# Patient Record
Sex: Male | Born: 1960 | Race: White | Hispanic: No | Marital: Single | Smoking: Current every day smoker
Health system: Southern US, Community
[De-identification: ages and names within clinical notes are randomized; demographics above are authoritative.]

## PROBLEM LIST (undated history)

## (undated) DIAGNOSIS — I10 Essential (primary) hypertension: Secondary | ICD-10-CM

## (undated) HISTORY — PX: HERNIA REPAIR: SHX51

---

## 2017-11-17 ENCOUNTER — Other Ambulatory Visit: Payer: Self-pay

## 2017-11-17 ENCOUNTER — Emergency Department (HOSPITAL_COMMUNITY): Payer: Self-pay

## 2017-11-17 ENCOUNTER — Encounter (HOSPITAL_COMMUNITY): Payer: Self-pay | Admitting: Emergency Medicine

## 2017-11-17 ENCOUNTER — Inpatient Hospital Stay (HOSPITAL_COMMUNITY)
Admission: EM | Admit: 2017-11-17 | Discharge: 2017-11-20 | DRG: 418 | Disposition: A | Discharge status: Home / Self Care | Payer: Self-pay | Attending: General Surgery | Admitting: General Surgery

## 2017-11-17 DIAGNOSIS — R7302 Impaired glucose tolerance (oral): Secondary | ICD-10-CM | POA: Diagnosis present

## 2017-11-17 DIAGNOSIS — F17210 Nicotine dependence, cigarettes, uncomplicated: Secondary | ICD-10-CM | POA: Diagnosis present

## 2017-11-17 DIAGNOSIS — R7989 Other specified abnormal findings of blood chemistry: Secondary | ICD-10-CM | POA: Insufficient documentation

## 2017-11-17 DIAGNOSIS — R739 Hyperglycemia, unspecified: Secondary | ICD-10-CM

## 2017-11-17 DIAGNOSIS — Z79899 Other long term (current) drug therapy: Secondary | ICD-10-CM

## 2017-11-17 DIAGNOSIS — I10 Essential (primary) hypertension: Secondary | ICD-10-CM | POA: Diagnosis present

## 2017-11-17 DIAGNOSIS — R000 Tachycardia, unspecified: Secondary | ICD-10-CM | POA: Diagnosis present

## 2017-11-17 DIAGNOSIS — I248 Other forms of acute ischemic heart disease: Secondary | ICD-10-CM | POA: Diagnosis present

## 2017-11-17 DIAGNOSIS — Z01810 Encounter for preprocedural cardiovascular examination: Secondary | ICD-10-CM

## 2017-11-17 DIAGNOSIS — K8000 Calculus of gallbladder with acute cholecystitis without obstruction: Principal | ICD-10-CM | POA: Diagnosis present

## 2017-11-17 DIAGNOSIS — R778 Other specified abnormalities of plasma proteins: Secondary | ICD-10-CM

## 2017-11-17 DIAGNOSIS — Z720 Tobacco use: Secondary | ICD-10-CM

## 2017-11-17 DIAGNOSIS — K810 Acute cholecystitis: Secondary | ICD-10-CM

## 2017-11-17 HISTORY — DX: Essential (primary) hypertension: I10

## 2017-11-17 LAB — RAPID URINE DRUG SCREEN, HOSP PERFORMED
Amphetamines: NOT DETECTED
Benzodiazepines: NOT DETECTED
Cocaine: NOT DETECTED
OPIATES: NOT DETECTED
Tetrahydrocannabinol: POSITIVE — AB

## 2017-11-17 LAB — URINALYSIS, ROUTINE W REFLEX MICROSCOPIC
Bilirubin Urine: NEGATIVE
Glucose, UA: 150 mg/dL — AB
HGB URINE DIPSTICK: NEGATIVE
KETONES UR: NEGATIVE mg/dL
LEUKOCYTES UA: NEGATIVE
Nitrite: NEGATIVE
PROTEIN: NEGATIVE mg/dL
Specific Gravity, Urine: >1.046 — ABNORMAL HIGH (ref 1.005–1.030)
pH: 7 (ref 5.0–8.0)

## 2017-11-17 LAB — COMPREHENSIVE METABOLIC PANEL
ALBUMIN: 4.3 g/dL (ref 3.5–5.0)
ALK PHOS: 67 U/L (ref 38–126)
ALT: 17 U/L (ref 0–44)
ANION GAP: 12 (ref 5–15)
AST: 19 U/L (ref 15–41)
BILIRUBIN TOTAL: 1.2 mg/dL (ref 0.3–1.2)
BUN: 19 mg/dL (ref 6–20)
CALCIUM: 9.1 mg/dL (ref 8.9–10.3)
CO2: 22 mmol/L (ref 22–32)
Chloride: 102 mmol/L (ref 98–111)
Creatinine, Ser: 0.97 mg/dL (ref 0.61–1.24)
GFR calc Af Amer: >60 mL/min (ref >60)
GLUCOSE: 198 mg/dL — AB (ref 70–99)
Potassium: 3.4 mmol/L — ABNORMAL LOW (ref 3.5–5.1)
Sodium: 136 mmol/L (ref 135–145)
TOTAL PROTEIN: 7.8 g/dL (ref 6.5–8.1)

## 2017-11-17 LAB — I-STAT TROPONIN, ED
TROPONIN I, POC: 0.09 ng/mL — AB (ref 0.00–0.08)
Troponin i, poc: 0.08 ng/mL (ref 0.00–0.08)

## 2017-11-17 LAB — CBC WITH DIFFERENTIAL/PLATELET
BASOS ABS: 0 10*3/uL (ref 0.0–0.1)
BASOS PCT: 0 %
Eosinophils Absolute: 0 10*3/uL (ref 0.0–0.7)
Eosinophils Relative: 0 %
HCT: 46.7 % (ref 39.0–52.0)
Hemoglobin: 16.3 g/dL (ref 13.0–17.0)
Lymphocytes Relative: 6 %
Lymphs Abs: 1.1 10*3/uL (ref 0.7–4.0)
MCH: 31 pg (ref 26.0–34.0)
MCHC: 34.9 g/dL (ref 30.0–36.0)
MCV: 88.8 fL (ref 78.0–100.0)
Monocytes Absolute: 1.1 10*3/uL — ABNORMAL HIGH (ref 0.1–1.0)
Monocytes Relative: 5 %
Neutro Abs: 17.6 10*3/uL — ABNORMAL HIGH (ref 1.7–7.7)
Neutrophils Relative %: 89 %
PLATELETS: 128 10*3/uL — AB (ref 150–400)
RBC: 5.26 MIL/uL (ref 4.22–5.81)
RDW: 13.7 % (ref 11.5–15.5)
WBC: 19.8 10*3/uL — AB (ref 4.0–10.5)

## 2017-11-17 LAB — LIPASE, BLOOD: Lipase: 25 U/L (ref 11–51)

## 2017-11-17 LAB — HEMOGLOBIN A1C
Hgb A1c MFr Bld: 5.9 % — ABNORMAL HIGH (ref 4.8–5.6)
MEAN PLASMA GLUCOSE: 122.63 mg/dL

## 2017-11-17 LAB — GLUCOSE, CAPILLARY
GLUCOSE-CAPILLARY: 158 mg/dL — AB (ref 70–99)
Glucose-Capillary: 169 mg/dL — ABNORMAL HIGH (ref 70–99)

## 2017-11-17 LAB — TROPONIN I
Troponin I: 0.15 ng/mL (ref <0.03)
Troponin I: 0.11 ng/mL (ref <0.03)

## 2017-11-17 MED ORDER — PIPERACILLIN-TAZOBACTAM 3.375 G IVPB
3.3750 g | Freq: Three times a day (TID) | INTRAVENOUS | Status: DC
  Administered 2017-11-17 – 2017-11-19 (×7): 3.375 g via INTRAVENOUS
  Filled 2017-11-17 (×6): qty 50

## 2017-11-17 MED ORDER — IOPAMIDOL (ISOVUE-370) INJECTION 76%
100.0000 mL | Freq: Once | INTRAVENOUS | Status: AC | PRN
  Administered 2017-11-17: 100 mL via INTRAVENOUS

## 2017-11-17 MED ORDER — INSULIN ASPART 100 UNIT/ML ~~LOC~~ SOLN: 0.0000 [IU] | Freq: Every day | SUBCUTANEOUS | Status: DC

## 2017-11-17 MED ORDER — POTASSIUM CHLORIDE IN NACL 20-0.9 MEQ/L-% IV SOLN
INTRAVENOUS | Status: DC
  Administered 2017-11-17 – 2017-11-19 (×5): via INTRAVENOUS

## 2017-11-17 MED ORDER — PIPERACILLIN-TAZOBACTAM 3.375 G IVPB 30 MIN
3.3750 g | Freq: Once | INTRAVENOUS | Status: AC
  Administered 2017-11-17: 3.375 g via INTRAVENOUS
  Filled 2017-11-17: qty 50

## 2017-11-17 MED ORDER — HYDRALAZINE HCL 20 MG/ML IJ SOLN
10.0000 mg | Freq: Once | INTRAMUSCULAR | Status: AC
  Administered 2017-11-17: 10 mg via INTRAVENOUS
  Filled 2017-11-17: qty 1

## 2017-11-17 MED ORDER — MORPHINE SULFATE (PF) 2 MG/ML IV SOLN
2.0000 mg | INTRAVENOUS | Status: DC | PRN
Start: 2017-11-17 — End: 2017-11-19
  Administered 2017-11-17 – 2017-11-19 (×8): 2 mg via INTRAVENOUS
  Filled 2017-11-17 (×8): qty 1

## 2017-11-17 MED ORDER — ONDANSETRON HCL 4 MG PO TABS: 4.0000 mg | ORAL_TABLET | Freq: Four times a day (QID) | ORAL | Status: DC | PRN

## 2017-11-17 MED ORDER — OXYCODONE HCL 5 MG PO TABS
10.0000 mg | ORAL_TABLET | Freq: Once | ORAL | Status: AC
  Administered 2017-11-17: 10 mg via ORAL
  Filled 2017-11-17: qty 2

## 2017-11-17 MED ORDER — SODIUM CHLORIDE 0.9 % IV BOLUS (SEPSIS)
1000.0000 mL | Freq: Once | INTRAVENOUS | Status: AC
  Administered 2017-11-17: 1000 mL via INTRAVENOUS

## 2017-11-17 MED ORDER — SODIUM CHLORIDE 0.9 % IV BOLUS
1000.0000 mL | Freq: Once | INTRAVENOUS | Status: AC
  Administered 2017-11-17: 1000 mL via INTRAVENOUS

## 2017-11-17 MED ORDER — ACETAMINOPHEN 325 MG PO TABS: 650.0000 mg | ORAL_TABLET | Freq: Four times a day (QID) | ORAL | Status: DC | PRN

## 2017-11-17 MED ORDER — METOPROLOL TARTRATE 5 MG/5ML IV SOLN
2.5000 mg | Freq: Four times a day (QID) | INTRAVENOUS | Status: DC
  Administered 2017-11-17 – 2017-11-18 (×3): 2.5 mg via INTRAVENOUS
  Filled 2017-11-17 (×3): qty 5

## 2017-11-17 MED ORDER — ONDANSETRON HCL 4 MG/2ML IJ SOLN: 4.0000 mg | Freq: Four times a day (QID) | INTRAMUSCULAR | Status: DC | PRN

## 2017-11-17 MED ORDER — PANTOPRAZOLE SODIUM 40 MG IV SOLR
40.0000 mg | Freq: Once | INTRAVENOUS | Status: AC
Start: 2017-11-17 — End: 2017-11-17
  Administered 2017-11-17: 40 mg via INTRAVENOUS
  Filled 2017-11-17: qty 40

## 2017-11-17 MED ORDER — SODIUM CHLORIDE 0.9 % IV SOLN: 1000.0000 mL | INTRAVENOUS | Status: DC

## 2017-11-17 MED ORDER — GI COCKTAIL ~~LOC~~
30.0000 mL | Freq: Once | ORAL | Status: AC
  Administered 2017-11-17: 30 mL via ORAL
  Filled 2017-11-17: qty 30

## 2017-11-17 MED ORDER — ENOXAPARIN SODIUM 40 MG/0.4ML ~~LOC~~ SOLN
40.0000 mg | SUBCUTANEOUS | Status: DC
  Administered 2017-11-17 – 2017-11-18 (×2): 40 mg via SUBCUTANEOUS
  Filled 2017-11-17 (×2): qty 0.4

## 2017-11-17 MED ORDER — HYDROMORPHONE HCL 1 MG/ML IJ SOLN
1.0000 mg | Freq: Once | INTRAMUSCULAR | Status: AC
  Administered 2017-11-17: 1 mg via INTRAVENOUS
  Filled 2017-11-17: qty 1

## 2017-11-17 MED ORDER — INSULIN ASPART 100 UNIT/ML ~~LOC~~ SOLN
0.0000 [IU] | Freq: Three times a day (TID) | SUBCUTANEOUS | Status: DC
  Administered 2017-11-19: 1 [IU] via SUBCUTANEOUS

## 2017-11-17 MED ORDER — ONDANSETRON HCL 4 MG/2ML IJ SOLN
4.0000 mg | Freq: Once | INTRAMUSCULAR | Status: AC
Start: 2017-11-17 — End: 2017-11-17
  Administered 2017-11-17: 4 mg via INTRAVENOUS
  Filled 2017-11-17: qty 2

## 2017-11-17 MED ORDER — ACETAMINOPHEN 650 MG RE SUPP: 650.0000 mg | Freq: Four times a day (QID) | RECTAL | Status: DC | PRN

## 2017-11-17 MED ORDER — FAMOTIDINE IN NACL 20-0.9 MG/50ML-% IV SOLN
20.0000 mg | Freq: Once | INTRAVENOUS | Status: AC
Start: 2017-11-17 — End: 2017-11-17
  Administered 2017-11-17: 20 mg via INTRAVENOUS
  Filled 2017-11-17: qty 50

## 2017-11-17 NOTE — ED Triage Notes (Signed)
PT c/o upper abdominal cramping with n/v x1 day. PT states normal BM this morning and denies any urinary symptoms.

## 2017-11-17 NOTE — ED Notes (Signed)
CRITICAL VALUE ALERT  Critical Value:  Troponin 0.09  Date & Time Notied:  11/17/2017, 1137  Provider Notified: Dr. Particia NearingHaviland  Orders Received/Actions taken: see chart

## 2017-11-17 NOTE — H&P (Signed)
History and Physical  Geovanie Winnett LKG:[id number] DOB: [birth year] DOA: 11/17/2017   PCP: Patient, No Pcp Per   Patient coming from: Home  Chief Complaint: epigastric pain  HPI:  Javonn Gauger is a 57 y.o. male with medical history of hypertension and tobacco abuse presenting with epigastric and right upper quadrant pain that began on the evening of 11/16/2017 after he ate some crab legs at a [employer].  The patient had numerous episodes of nausea and vomiting without any hematemesis, hematochezia, melena, diarrhea.  The patient denied any fevers, chills, chest pain, shortness breath, palpitations.  He denies any new medications.  The patient had been in his usual state of health prior to development of his abdominal pain.  There is no dysuria, hematuria, headache, neck pain.  Because of persistent nausea and vomiting and worsening abdominal pain, he presented to the emergency department for further evaluation.  The patient smokes 1/2 pack/day, but denies any alcohol or illicit drug use.  In the emergency department, WBC was 19.8.  The patient was afebrile hemodynamically stable saturating 95% on nasal cannula 2 L.  CT of the abdomen and pelvis was obtained and showed mild gallbladder wall thickening with a small amount of fluid in the gallbladder neck to the pancreatico duodenal groove concerning for pericholecystic fluid.  There is also tiny echogenic foci in the region of the cystic duct.  Because of concerns for acute cholecystitis, the patient was started on IV fluids and IV antibiotics.  General surgery was consulted to assist with management.  Assessment/Plan: Acute cholecystitis -Continue IV fluids  -Start IV Zosyn  -Pain control IV -Right upper quadrant abdominal pain [place] -General surgery consultation -11/17/2017 CT abdomen pelvis--mild gallbladder wall thickening with a small amount of fluid in the gallbladder neck to the pancreatico-duodenal groove concerning for pericholecystic  fluid   Essential hypertension -Convert metoprolol to IV  Tobacco abuse -I have discussed tobacco cessation with the patient.  I have counseled the patient regarding the negative impacts of continued tobacco use including but not limited to lung cancer, COPD, and cardiovascular disease.  I have discussed alternatives to tobacco and modalities that may help facilitate tobacco cessation including but not limited to biofeedback, hypnosis, and medications.  Total time spent with tobacco counseling was 4 minutes. -nearly 50 pk years  Hyperglycemia -check a1C -novolog sliding scale  Elevated I-stat troponin -cycle troponins -echo       Past Medical History:  Diagnosis Date  . Hypertension    Past Surgical History:  Procedure Laterality Date  . HERNIA REPAIR     Social History:  reports that he has been smoking.  He has been smoking about 0.50 packs per day. He has never used smokeless tobacco. He reports that he drank alcohol. He reports that he does not use drugs.   History reviewed. No pertinent family history.   No Known Allergies   Prior to Admission medications   Medication Sig Start Date End Date Taking? Authorizing Provider  metoprolol tartrate (LOPRESSOR) 50 MG tablet Take 50 mg by mouth 2 (two) times daily. 08/29/17  Yes [provider]    Review of Systems:  Constitutional:  No weight loss, night sweats, Fevers, chills Head&Eyes: No headache.  No vision loss.  No eye pain or scotoma ENT:  No Difficulty swallowing,Tooth/dental problems,Sore throat,  No ear ache, post nasal drip,  Cardio-vascular:  No chest pain, Orthopnea, PND, swelling in lower extremities,  dizziness, palpitations  GI:  No  diarrhea, loss of appetite, hematochezia, melena, heartburn, indigestion, Resp:  No shortness of breath with exertion or at rest. No cough. No coughing up of blood .No wheezing.No chest wall deformity  Skin:  no rash or lesions.  GU:  no dysuria, change in  color of urine, no urgency or frequency. No flank pain.  Musculoskeletal:  No joint pain or swelling. No decreased range of motion. No back pain.  Psych:  No change in mood or affect. No depression or anxiety. Neurologic: No headache, no dysesthesia, no focal weakness, no vision loss. No syncope  Physical Exam: Vitals:   11/17/17 1430 11/17/17 1500 11/17/17 1557 11/17/17 1600  BP: (!) 150/89 (!) 150/88 (!) 162/104 (!) 166/99  Pulse: (!) 103 (!) 109 (!) 111 (!) 101  Resp: (!) 23 (!) 24 20   Temp:      TempSrc:      SpO2: 100% 100% 100% 98%  Weight:      Height:       General:  A&O x 3, NAD, nontoxic, pleasant/cooperative Head/Eye: No conjunctival hemorrhage, no icterus, [place]/AT, No nystagmus ENT:  No icterus,  No thrush, good dentition, no pharyngeal exudate Neck:  No masses, no lymphadenpathy, no bruits CV:  RRR, no rub, no gallop, no S3 Lung:  Right basilar crackles, no wheeze Abdomen: soft/RUQ and epigastric, +BS, nondistended, no peritoneal signs Ext: No cyanosis, No rashes, No petechiae, No lymphangitis, No edema Neuro: CNII-XII intact, strength 4/5 in bilateral upper and lower extremities, no dysmetria  Labs on Admission:  Basic Metabolic Panel: Recent Labs  Lab 11/17/17 1030  NA 136  K 3.4*  CL 102  CO2 22  GLUCOSE 198*  BUN 19  CREATININE 0.97  CALCIUM 9.1   Liver Function Tests: Recent Labs  Lab 11/17/17 1030  AST 19  ALT 17  ALKPHOS 67  BILITOT 1.2  PROT 7.8  ALBUMIN 4.3   Recent Labs  Lab 11/17/17 1030  LIPASE 25   No results for input(s): AMMONIA in the last 168 hours. CBC: Recent Labs  Lab 11/17/17 1030  WBC 19.8*  NEUTROABS 17.6*  HGB 16.3  HCT 46.7  MCV 88.8  PLT 128*   Coagulation Profile: No results for input(s): INR, PROTIME in the last 168 hours. Cardiac Enzymes: No results for input(s): CKTOTAL, CKMB, CKMBINDEX, TROPONINI in the last 168 hours. BNP: Invalid input(s): POCBNP CBG: No results for input(s): GLUCAP in the last  168 hours. Urine analysis:    Component Value Date/Time   COLORURINE YELLOW 11/17/2017 1530   APPEARANCEUR CLEAR 11/17/2017 1530   LABSPEC >1.046 (H) 11/17/2017 1530   PHURINE 7.0 11/17/2017 1530   GLUCOSEU 150 (A) 11/17/2017 1530   HGBUR NEGATIVE 11/17/2017 1530   BILIRUBINUR NEGATIVE 11/17/2017 1530   KETONESUR NEGATIVE 11/17/2017 1530   PROTEINUR NEGATIVE 11/17/2017 1530   NITRITE NEGATIVE 11/17/2017 1530   LEUKOCYTESUR NEGATIVE 11/17/2017 1530   Sepsis Labs: @LABRCNTIP (procalcitonin:4,lacticidven:4) )No results found for this or any previous visit (from the past 240 hour(s)).   Radiological Exams on Admission: Dg Chest Portable 1 View  Result Date: 11/17/2017 CLINICAL DATA:  57 year old male with a history of epigastric abdominal pain and nausea EXAM: PORTABLE CHEST 1 VIEW COMPARISON:  None. FINDINGS: Cardiomediastinal silhouette within normal limits. Coarsened interstitial markings bilaterally. Low lung volumes. No confluent airspace disease. No evidence of pleural effusion or pneumothorax. No interlobular septal thickening. Degenerative changes of the bilateral shoulders. Unremarkable upper abdomen. IMPRESSION: Low lung volumes with likely chronic changes/atelectasis, and no radiographic evidence  of acute cardiopulmonary disease. Electronically Signed   By: Gilmer Mor D.O.   On: 11/17/2017 10:51   Ct Angio Abd/pel W/ And/or W/o  Result Date: 11/17/2017 CLINICAL DATA:  57 year old male with upper abdominal cramping, nausea and vomiting for 1 day. EXAM: CT ANGIOGRAPHY ABDOMEN AND PELVIS WITH CONTRAST AND WITHOUT CONTRAST TECHNIQUE: Multidetector CT imaging of the abdomen and pelvis was performed using the standard protocol during bolus administration of intravenous contrast. Multiplanar reconstructed images and MIPs were obtained and reviewed to evaluate the vascular anatomy. CONTRAST:  ISOVUE-370 IOPAMIDOL (ISOVUE-370) INJECTION 76% COMPARISON:  None. FINDINGS: VASCULAR Aorta:  The aorta is normal in caliber without evidence of aneurysm or dissection. Minimal calcified atherosclerotic plaque. Celiac: Patent without evidence of aneurysm, dissection, vasculitis or significant stenosis. SMA: Patent without evidence of aneurysm, dissection, vasculitis or significant stenosis. Renals: Both renal arteries are patent without evidence of aneurysm, dissection, vasculitis, fibromuscular dysplasia or significant stenosis. IMA: Patent without evidence of aneurysm, dissection, vasculitis or significant stenosis. Inflow: Patent without evidence of aneurysm, dissection, vasculitis or significant stenosis. Proximal Outflow: Bilateral common femoral and visualized portions of the superficial and profunda femoral arteries are patent without evidence of aneurysm, dissection, vasculitis or significant stenosis. Veins: No obvious venous abnormality within the limitations of this arterial phase study. Review of the MIP images confirms the above findings. NON-VASCULAR Lower chest: Trace dependent atelectasis bilaterally. Otherwise, the visualized lower chest is unremarkable. Hepatobiliary: Normal hepatic parenchyma with some arterial hyperenhancement around the gallbladder. No discrete solid lesions. There are a few tiny subcentimeter low-attenuation lesions which are too small to characterize but statistically likely to represent benign cysts. Mild gallbladder wall thickening. Suggestion of tiny punctate echogenic foci in the region of the cystic duct. Small volume fluid extends from the region of the gallbladder neck into the pancreaticoduodenal groove. This may represent pericholecystic fluid. Pancreas: Normal enhancement of the pancreas. No definite pancreatic edema or hypoperfusion. No mass lesion present. Fluid confined to the pancreaticoduodenal groove. Spleen: Normal in size without focal abnormality. Adrenals/Urinary Tract: Normal adrenal glands. Symmetric enhancement of the kidneys bilaterally. No  hydronephrosis, nephrolithiasis or enhancing renal mass. Tiny subcentimeter low-attenuation lesion exophytic from the upper pole of the right kidney is too small to characterize but statistically highly likely a benign cyst. Stomach/Bowel: The stomach is within normal limits. Perhaps mild wall thickening of the second portion of the duodenum which is likely reactive. Normal appendix in the right lower quadrant. The terminal ileum is unremarkable. No evidence of bowel obstruction. Lymphatic: No suspicious lymphadenopathy. Reproductive: Prostate is unremarkable. Other: No abdominal wall hernia or abnormality. No abdominopelvic ascites. Musculoskeletal: No acute fracture or aggressive appearing lytic or blastic osseous lesion. IMPRESSION: VASCULAR 1. No acute arterial abnormality. 2.  Aortic Atherosclerosis (ICD10-170.0). NON-VASCULAR 1. CT findings are most suggestive of acute cholecystitis. There appear to be tiny punctate stones in the region of the gallbladder neck/cystic duct, the gallbladder wall is slightly thickened, there is hyperemia in the hepatic parenchyma adjacent to the gallbladder, and finally there is pericholecystic fluid extending along the porta hepatis into the pancreaticoduodenal groove. Additional differential considerations which are considered less likely include primary duodenitis, or mild pancreatitis. The epicenter appears to be the gallbladder with secondary inflammation involving the pancreaticoduodenal groove. Consider further evaluation with right upper quadrant ultrasound. Electronically Signed   By: Malachy Moan M.D.   On: 11/17/2017 15:12    EKG: Independently reviewed.  Sinus rhythm, nonspecific T wave change    Time spent:60 minutes Code Status:  Full code Family Communication:  No Family at bedside Disposition Plan: expect 2-3 day hospitalization Consults called: General surgery DVT Prophylaxis: [place] Lovenox  Catarina Hartshorn, DO  [facility] Pager  [phone]  If 7PM-7AM, please contact night-coverage [email or web address] Password [facility] 11/17/2017, 4:44 PM

## 2017-11-17 NOTE — ED Notes (Signed)
Pt placed on 2L [place] due to sats dropping to 88%.  Pt able to speak and respond appropriately.

## 2017-11-17 NOTE — ED Provider Notes (Signed)
[facility] Provider Note   CSN: [id number] Arrival date & time: 11/17/17  0940     History   Chief Complaint Chief Complaint  Patient presents with  . Abdominal Pain    HPI [facility]arez is a 57 y.o. male with history of HTN, HLD, acid reflux, hernia repair is here for abdominal pain.  Onset yesterday.  Pain is described as cramping, severe, 10/10, constant.  Localized to above umbilicus, intermittently radiates to his back.  Associated with nausea, vomiting.  Has noted some brown emesis but no bright red blood.  States pain, nausea and vomiting is similar to flares of acid reflux but states that has never been this severe.  Has been compliant with ranitidine.  No recent exposure to irritating foods.  Abdominal pain is worse with any movement, palpation.  Denies heavy EtOH or NSAID use.  Tried to go to urgent care but it was closed today.  Did not take blood pressure meds today.   Denies fevers, chills, radiation of pain into his chest, shortness of breath, dysuria, hematuria, flank pain, changes to bowel movements.  Twin brother had heart attack 65-50 yo. 1/2 ppd cigarettes.   HPI  Past Medical History:  Diagnosis Date  . Hypertension     There are no active problems to display for this patient.   Past Surgical History:  Procedure Laterality Date  . HERNIA REPAIR          Home Medications    Prior to Admission medications   Medication Sig Start Date End Date Taking? Authorizing Provider  metoprolol tartrate (LOPRESSOR) 50 MG tablet Take 50 mg by mouth 2 (two) times daily. 08/29/17  Yes [provider]    Family History History reviewed. No pertinent family history.  Social History Social History   Tobacco Use  . Smoking status: Current Every Day Smoker    Packs/day: 0.50  . Smokeless tobacco: Never Used  Substance Use Topics  . Alcohol use: Not Currently  . Drug use: Never     Allergies   Patient has no known  allergies.   Review of Systems Review of Systems  Gastrointestinal: Positive for abdominal pain, nausea and vomiting.  All other systems reviewed and are negative.    Physical Exam Updated Vital Signs BP (!) 162/104 (BP Location: Right Arm)   Pulse (!) 111   Temp 97.8 F (36.6 C) (Oral)   Resp 20   Ht _0  (1.854 m)   Wt 97.5 kg (215 lb)   SpO2 100%   BMI 28.37 kg/m   Physical Exam  Constitutional: He is oriented to person, place, and time. He appears well-developed and well-nourished. No distress.  Looks uncomfortable but non toxic.  HENT:  Head: Normocephalic and atraumatic.  Nose: Nose normal.  Dry lips but moist mucous membranes   Eyes: Pupils are equal, round, and reactive to light. Conjunctivae and EOM are normal.  Neck: Normal range of motion.  Cardiovascular: Regular rhythm, S1 normal, S2 normal and normal heart sounds. Tachycardia present.  2+ DP and radial pulses bilaterally. No LE edema.   Pulmonary/Chest: Effort normal and breath sounds normal.  No chest wall tenderness   Abdominal: Soft. Bowel sounds are normal. There is tenderness. There is rigidity and guarding.  Marked tenderness diffusely to epigastrium and above umbilicus with guarding and rigidity. Positive Murphy's.  No suprapubic or CVA tenderness. Negative McBurney's. No distention. Active BS.   Musculoskeletal: Normal range of motion.  Neurological: He is alert  and oriented to person, place, and time.  Skin: Skin is warm and dry. Capillary refill takes less than 2 seconds.  Psychiatric: He has a normal mood and affect. His behavior is normal. Judgment and thought content normal.  Nursing note and vitals reviewed.    ED Treatments / Results  Labs (all labs ordered are listed, but only abnormal results are displayed) Labs Reviewed  COMPREHENSIVE METABOLIC PANEL - Abnormal; Notable for the following components:      Result Value   Potassium 3.4 (*)    Glucose, Bld 198 (*)    All other  components within normal limits  URINALYSIS, ROUTINE W REFLEX MICROSCOPIC - Abnormal; Notable for the following components:   Specific Gravity, Urine >1.046 (*)    Glucose, UA 150 (*)    All other components within normal limits  CBC WITH DIFFERENTIAL/PLATELET - Abnormal; Notable for the following components:   WBC 19.8 (*)    Platelets 128 (*)    Neutro Abs 17.6 (*)    Monocytes Absolute 1.1 (*)    All other components within normal limits  I-STAT TROPONIN, ED - Abnormal; Notable for the following components:   Troponin i, poc 0.09 (*)    All other components within normal limits  LIPASE, BLOOD  RAPID URINE DRUG SCREEN, HOSP PERFORMED  I-STAT TROPONIN, ED    EKG EKG Interpretation  Date/Time:  Monday November 17 2017 11:33:30 EDT Ventricular Rate:  106 PR Interval:    QRS Duration: 83 QT Interval:  373 QTC Calculation: 496 R Axis:   41 Text Interpretation:  Sinus tachycardia LAE, consider biatrial enlargement Abnormal R-wave progression, early transition Borderline prolonged QT interval Baseline wander in lead(s) V3 V4 V5 V6 No old tracing to compare Confirmed by Isla Pence [phone]) on 11/17/2017 12:04:14 PM   Radiology Dg Chest Portable 1 View  Result Date: 11/17/2017 CLINICAL DATA:  57 year old male with a history of epigastric abdominal pain and nausea EXAM: PORTABLE CHEST 1 VIEW COMPARISON:  [facility]. FINDINGS: Cardiomediastinal silhouette within normal limits. Coarsened interstitial markings bilaterally. Low lung volumes. No confluent airspace disease. No evidence of pleural effusion or pneumothorax. No interlobular septal thickening. Degenerative changes of the bilateral shoulders. Unremarkable upper abdomen. IMPRESSION: Low lung volumes with likely chronic changes/atelectasis, and no radiographic evidence of acute cardiopulmonary disease. Electronically Signed   By: Corrie Mckusick D.O.   On: 11/17/2017 10:51   Ct Angio Abd/pel W/ And/or W/o  Result Date: 11/17/2017 CLINICAL  DATA:  57 year old male with upper abdominal cramping, nausea and vomiting for 1 day. EXAM: CT ANGIOGRAPHY ABDOMEN AND PELVIS WITH CONTRAST AND WITHOUT CONTRAST TECHNIQUE: Multidetector CT imaging of the abdomen and pelvis was performed using the standard protocol during bolus administration of intravenous contrast. Multiplanar reconstructed images and MIPs were obtained and reviewed to evaluate the vascular anatomy. CONTRAST:  132m ISOVUE-370 IOPAMIDOL (ISOVUE-370) INJECTION 76% COMPARISON:  [facility]. FINDINGS: VASCULAR Aorta: The aorta is normal in caliber without evidence of aneurysm or dissection. Minimal calcified atherosclerotic plaque. Celiac: Patent without evidence of aneurysm, dissection, vasculitis or significant stenosis. SMA: Patent without evidence of aneurysm, dissection, vasculitis or significant stenosis. Renals: Both renal arteries are patent without evidence of aneurysm, dissection, vasculitis, fibromuscular dysplasia or significant stenosis. IMA: Patent without evidence of aneurysm, dissection, vasculitis or significant stenosis. Inflow: Patent without evidence of aneurysm, dissection, vasculitis or significant stenosis. Proximal Outflow: Bilateral common femoral and visualized portions of the superficial and profunda femoral arteries are patent without evidence of aneurysm, dissection, vasculitis or significant stenosis.  Veins: No obvious venous abnormality within the limitations of this arterial phase study. Review of the MIP images confirms the above findings. NON-VASCULAR Lower chest: Trace dependent atelectasis bilaterally. Otherwise, the visualized lower chest is unremarkable. Hepatobiliary: Normal hepatic parenchyma with some arterial hyperenhancement around the gallbladder. No discrete solid lesions. There are a few tiny subcentimeter low-attenuation lesions which are too small to characterize but statistically likely to represent benign cysts. Mild gallbladder wall thickening. Suggestion of  tiny punctate echogenic foci in the region of the cystic duct. Small volume fluid extends from the region of the gallbladder neck into the pancreaticoduodenal groove. This may represent pericholecystic fluid. Pancreas: Normal enhancement of the pancreas. No definite pancreatic edema or hypoperfusion. No mass lesion present. Fluid confined to the pancreaticoduodenal groove. Spleen: Normal in size without focal abnormality. Adrenals/Urinary Tract: Normal adrenal glands. Symmetric enhancement of the kidneys bilaterally. No hydronephrosis, nephrolithiasis or enhancing renal mass. Tiny subcentimeter low-attenuation lesion exophytic from the upper pole of the right kidney is too small to characterize but statistically highly likely a benign cyst. Stomach/Bowel: The stomach is within normal limits. Perhaps mild wall thickening of the second portion of the duodenum which is likely reactive. Normal appendix in the right lower quadrant. The terminal ileum is unremarkable. No evidence of bowel obstruction. Lymphatic: No suspicious lymphadenopathy. Reproductive: Prostate is unremarkable. Other: No abdominal wall hernia or abnormality. No abdominopelvic ascites. Musculoskeletal: No acute fracture or aggressive appearing lytic or blastic osseous lesion. IMPRESSION: VASCULAR 1. No acute arterial abnormality. 2.  Aortic Atherosclerosis (ICD10-170.0). NON-VASCULAR 1. CT findings are most suggestive of acute cholecystitis. There appear to be tiny punctate stones in the region of the gallbladder neck/cystic duct, the gallbladder wall is slightly thickened, there is hyperemia in the hepatic parenchyma adjacent to the gallbladder, and finally there is pericholecystic fluid extending along the porta hepatis into the pancreaticoduodenal groove. Additional differential considerations which are considered less likely include primary duodenitis, or mild pancreatitis. The epicenter appears to be the gallbladder with secondary inflammation  involving the pancreaticoduodenal groove. Consider further evaluation with right upper quadrant ultrasound. Electronically Signed   By: Jacqulynn Cadet M.D.   On: 11/17/2017 15:12    Procedures .Critical Care Performed by: Kinnie Feil, PA-C Authorized by: Kinnie Feil, PA-C   Critical care provider statement:    Critical care time (minutes):  35   Critical care was necessary to treat or prevent imminent or life-threatening deterioration of the following conditions: cholecystitis and elevated troponin.   Critical care was time spent personally by me on the following activities:  Development of treatment plan with patient or surrogate, discussions with consultants, evaluation of patient's response to treatment, examination of patient, review of old charts, re-evaluation of patient's condition, ordering and review of radiographic studies and ordering and review of laboratory studies   I assumed direction of critical care for this patient from another provider in my specialty: no     (including critical care time)  Medications Ordered in ED Medications  sodium chloride 0.9 % bolus 1,000 mL (1,000 mLs Intravenous New Bag/Given 11/17/17 1555)    Followed by  0.9 %  sodium chloride infusion (has no administration in time range)  piperacillin-tazobactam (ZOSYN) IVPB 3.375 g (3.375 g Intravenous New Bag/Given 11/17/17 1555)  HYDROmorphone (DILAUDID) injection 1 mg (1 mg Intravenous Given 11/17/17 1042)  ondansetron (ZOFRAN) injection 4 mg (4 mg Intravenous Given 11/17/17 1042)  gi cocktail (Maalox,Lidocaine,Donnatal) (30 mLs Oral Given 11/17/17 1239)  sodium chloride 0.9 % bolus  1,000 mL (0 mLs Intravenous Stopped 11/17/17 1555)  pantoprazole (PROTONIX) injection 40 mg (40 mg Intravenous Given 11/17/17 1242)  famotidine (PEPCID) IVPB 20 mg premix (0 mg Intravenous Stopped 11/17/17 1321)  iopamidol (ISOVUE-370) 76 % injection 100 mL (100 mLs Intravenous Contrast Given 11/17/17 1327)     Initial  Impression / Assessment and Plan / ED Course  I have reviewed the triage vital signs and the nursing notes.  Pertinent labs & imaging results that were available during my care of the patient were reviewed by me and considered in my medical decision making (see chart for details).  Clinical Course as of Nov 17 1608  Mon Nov 17, 2017  1017 BP(!): 188/110 [CG]  1018 Pulse Rate(!): 108 [CG]  1018 SpO2: 93 % [CG]  1111 WBC(!): 19.8 [CG]  1203 Troponin i, poc(!!): 0.09 [CG]  1208 Borderline prolonged Qtc  EKG 12-Lead [CG]  1519 1. CT findings are most suggestive of acute cholecystitis. There appear to be tiny punctate stones in the region of the gallbladder neck/cystic duct, the gallbladder wall is slightly thickened, there is hyperemia in the hepatic parenchyma adjacent to the gallbladder, and finally there is pericholecystic fluid extending along the porta hepatis into the pancreaticoduodenal groove. Additional differential considerations which are considered less likely include primary duodenitis, or mild pancreatitis. The epicenter appears to be the gallbladder with secondary inflammation involving the pancreaticoduodenal groove. Consider further evaluation with right upper quadrant ultrasound.    CT Angio Abd/Pel w/ and/or w/o [CG]  1603 WBC(!): 19.8 [CG]  1603 Troponin i, poc(!!): 0.09 [CG]  1604 Pulse Rate(!): 111 [CG]  1604 BP(!): 162/104 [CG]    Clinical Course User Index [CG] Kinnie Feil, PA-C    Ddx includes pancreatitis, cholecystitis, GERD/PUD.  Given severity of symptoms and elevated BP also considered dissection, perforated ulcer.  He has no CP, Sob, cardiac history and ACS lower on differential.   On exam he is tachycardic, but non toxic. Some peritonitis noted. SIRS criteria met however pt overall non toxic, may be developing early sepsis or his tachycardia may be from pain/stress response as well as dehydration.   Final Clinical Impressions(s) / ED  Diagnoses   1600: CT AP shows cholecystitis with stone at cystic duct.  WBC 19.8. Troponin 0.09, ?reactive or stress. Lipase, LFT WNL. EKG without ischemic changes. No significant arthrosclerosis on CTA of A/P. Spoke to Dr. Arnoldo Morale who anticipates pt may need cardiology clearance before cholecystectomy.  Liquid diet and NPO after midnight tonight depending on cardiology recommendations. Admit to hospital medicine. Pending delta troponin and UA.   1656: Spoke to hospitalist who will see pt in ER and admit  Final diagnoses:  Cholecystitis, acute  Elevated troponin    ED Discharge Orders    [facility]       Kinnie Feil, PA-C 11/17/17 1657    Isla Pence, MD 11/19/17 [phone]

## 2017-11-17 NOTE — ED Notes (Signed)
Patient requesting pain medication. Released order for PRN morphine to be given.

## 2017-11-18 ENCOUNTER — Inpatient Hospital Stay (HOSPITAL_COMMUNITY): Payer: Self-pay

## 2017-11-18 DIAGNOSIS — Z01810 Encounter for preprocedural cardiovascular examination: Secondary | ICD-10-CM

## 2017-11-18 DIAGNOSIS — I5030 Unspecified diastolic (congestive) heart failure: Secondary | ICD-10-CM

## 2017-11-18 DIAGNOSIS — R748 Abnormal levels of other serum enzymes: Secondary | ICD-10-CM

## 2017-11-18 DIAGNOSIS — I10 Essential (primary) hypertension: Secondary | ICD-10-CM

## 2017-11-18 LAB — COMPREHENSIVE METABOLIC PANEL
ALBUMIN: 3.8 g/dL (ref 3.5–5.0)
ALK PHOS: 66 U/L (ref 38–126)
ALT: 25 U/L (ref 0–44)
ANION GAP: 9 (ref 5–15)
AST: 28 U/L (ref 15–41)
BUN: 14 mg/dL (ref 6–20)
CO2: 23 mmol/L (ref 22–32)
Calcium: 8.5 mg/dL — ABNORMAL LOW (ref 8.9–10.3)
Chloride: 102 mmol/L (ref 98–111)
Creatinine, Ser: 0.94 mg/dL (ref 0.61–1.24)
GFR calc Af Amer: >60 mL/min (ref >60)
GFR calc non Af Amer: >60 mL/min (ref >60)
GLUCOSE: 158 mg/dL — AB (ref 70–99)
POTASSIUM: 3.9 mmol/L (ref 3.5–5.1)
Sodium: 134 mmol/L — ABNORMAL LOW (ref 135–145)
Total Bilirubin: 1.6 mg/dL — ABNORMAL HIGH (ref 0.3–1.2)
Total Protein: 7.1 g/dL (ref 6.5–8.1)

## 2017-11-18 LAB — TROPONIN I: TROPONIN I: 0.08 ng/mL — AB (ref <0.03)

## 2017-11-18 LAB — GLUCOSE, CAPILLARY
GLUCOSE-CAPILLARY: 143 mg/dL — AB (ref 70–99)
GLUCOSE-CAPILLARY: 129 mg/dL — AB (ref 70–99)
Glucose-Capillary: 148 mg/dL — ABNORMAL HIGH (ref 70–99)
Glucose-Capillary: 131 mg/dL — ABNORMAL HIGH (ref 70–99)

## 2017-11-18 LAB — CBC
HEMATOCRIT: 46.1 % (ref 39.0–52.0)
HEMOGLOBIN: 15.9 g/dL (ref 13.0–17.0)
MCH: 31.4 pg (ref 26.0–34.0)
MCHC: 34.5 g/dL (ref 30.0–36.0)
MCV: 90.9 fL (ref 78.0–100.0)
Platelets: 137 10*3/uL — ABNORMAL LOW (ref 150–400)
RBC: 5.07 MIL/uL (ref 4.22–5.81)
RDW: 14.3 % (ref 11.5–15.5)
WBC: 20.9 10*3/uL — ABNORMAL HIGH (ref 4.0–10.5)

## 2017-11-18 LAB — ECHOCARDIOGRAM COMPLETE
Height: 73 in
Weight: 3456.81 oz

## 2017-11-18 LAB — HIV ANTIBODY (ROUTINE TESTING W REFLEX): HIV Screen 4th Generation wRfx: NONREACTIVE

## 2017-11-18 LAB — MAGNESIUM: Magnesium: 2 mg/dL (ref 1.7–2.4)

## 2017-11-18 MED ORDER — METOPROLOL TARTRATE 50 MG PO TABS
50.0000 mg | ORAL_TABLET | Freq: Two times a day (BID) | ORAL | Status: DC
  Administered 2017-11-18 – 2017-11-19 (×4): 50 mg via ORAL
  Filled 2017-11-18 (×4): qty 1

## 2017-11-18 MED ORDER — CHLORHEXIDINE GLUCONATE CLOTH 2 % EX PADS
6.0000 | MEDICATED_PAD | Freq: Once | CUTANEOUS | Status: AC
  Administered 2017-11-18: 6 via TOPICAL

## 2017-11-18 MED ORDER — CHLORHEXIDINE GLUCONATE CLOTH 2 % EX PADS
6.0000 | MEDICATED_PAD | Freq: Once | CUTANEOUS | Status: AC
  Administered 2017-11-19: 6 via TOPICAL

## 2017-11-18 NOTE — Progress Notes (Signed)
Normal echo. Ok to proceed with surgery from cardiac standpoint. We will sign off   Dominga FerryJ Cerenity Goshorn MD

## 2017-11-18 NOTE — Progress Notes (Signed)
PROGRESS NOTE  Blake Juarez UJW:[id number] DOB: 1960-12-24 DOA: 11/17/2017 PCP: Patient, No Pcp Per  Brief History:   57 y.o. male with medical history of hypertension and tobacco abuse presenting with epigastric and right upper quadrant pain that began on the evening of 11/16/2017 after he ate some crab legs at a [employer].  The patient had numerous episodes of nausea and vomiting without any hematemesis, hematochezia, melena, diarrhea.  The patient denied any fevers, chills, chest pain, shortness breath, palpitations.  He denies any new medications.  The patient had been in his usual state of health prior to development of his abdominal pain.  There is no dysuria, hematuria, headache, neck pain.  Because of persistent nausea and vomiting and worsening abdominal pain, he presented to the emergency department for further evaluation.  The patient smokes 1/2 pack/day, but denies any alcohol or illicit drug use.  In the emergency department, WBC was 19.8.  The patient was afebrile hemodynamically stable saturating 95% on nasal cannula 2 L.  CT of the abdomen and pelvis was obtained and showed mild gallbladder wall thickening with a small amount of fluid in the gallbladder neck to the pancreatico duodenal groove concerning for pericholecystic fluid.  There is also tiny echogenic foci in the region of the cystic duct.  Because of concerns for acute cholecystitis, the patient was started on IV fluids and IV antibiotics.  General surgery was consulted to assist with management.  Because of elevated troponin, cardiology was consulted for clearance for surgery.   Assessment/Plan: Acute cholecystitis -Continue IV fluids  -Continue IV Zosyn  -Pain control IV -Right upper quadrant abdominal--GB sludge withGB wall thickening -General surgery consultation--plan for lap chole 11/19/17 -11/17/2017 CT abdomen pelvis--mild gallbladder wall thickening with a small amount of fluid in the gallbladder neck to  the pancreatico-duodenal groove concerning for pericholecystic fluid   Essential hypertension -resume metoprolol 50 bid  Tobacco abuse -I have discussed tobacco cessation with the patient.  I have counseled the patient regarding the negative impacts of continued tobacco use including but not limited to lung cancer, COPD, and cardiovascular disease.  I have discussed alternatives to tobacco and modalities that may help facilitate tobacco cessation including but not limited to biofeedback, hypnosis, and medications.  Total time spent with tobacco counseling was 4 minutes. -nearly 50 pk years  Impaired glucose tolerance -check a1C--5.9 -CBGs elevated in part due to infection -novolog sliding scale  Elevated troponin -due to demand ischemia -personally reviewed EKG--sinus, nonspecific T wave change -cycle troponins--trend is flat -echo--EF 60 to 65%, grade 1 DD, no WMA -cardiology consult requested by anesthesia for surgical clearance--> cleared for surgery    Disposition Plan:   Home when cleared by general surgery Family Communication:  No Family at bedside  Consultants:  General surgery, cardiology  Code Status:  FULL  DVT Prophylaxis:   [place] Lovenox   Procedures: As Listed in Progress Note Above  Antibiotics: Zosyn 7/8>>>    Subjective: Patient continues to have right upper quadrant pain with nausea but he has not had any emesis.  He is afraid to eat because of causing more abdominal pain.  There is no diarrhea.  He denies any fevers, chills, chest pain, shortness breath, coughing, hemoptysis, diarrhea.  Objective: Vitals:   11/17/17 2145 11/17/17 2218 11/17/17 2300 11/18/17 0656  BP: (!) 180/102 (!) 160/90 (!) 150/92 (!) 144/89  Pulse:    (!) 113  Resp:  Temp:    99.4 F (37.4 C)  TempSrc:    Axillary  SpO2:    97%  Weight:      Height:        Intake/Output Summary (Last 24 hours) at 11/18/2017 0718 Last data filed at 11/18/2017 0400 Gross per 24 hour    Intake 1073.97 ml  Output -  Net 1073.97 ml   Weight change:  Exam:   General:  Pt is alert, follows commands appropriately, not in acute distress  HEENT: No icterus, No thrush, No neck mass, [place]/AT  Cardiovascular: RRR, S1/S2, no rubs, no gallops  Respiratory: Bibasilar rales but no wheezing.  Good air movement.  Abdomen: Soft/+BS, RUQ tender, non distended, no guarding  Extremities: No edema, No lymphangitis, No petechiae, No rashes, no synovitis   Data Reviewed: I have personally reviewed following labs and imaging studies Basic Metabolic Panel: Recent Labs  Lab 11/17/17 1030 11/18/17 0438  NA 136 134*  K 3.4* 3.9  CL 102 102  CO2 22 23  GLUCOSE 198* 158*  BUN 19 14  CREATININE 0.97 0.94  CALCIUM 9.1 8.5*  MG  --  2.0   Liver Function Tests: Recent Labs  Lab 11/17/17 1030 11/18/17 0438  AST 19 28  ALT 17 25  ALKPHOS 67 66  BILITOT 1.2 1.6*  PROT 7.8 7.1  ALBUMIN 4.3 3.8   Recent Labs  Lab 11/17/17 1030  LIPASE 25   No results for input(s): AMMONIA in the last 168 hours. Coagulation Profile: No results for input(s): INR, PROTIME in the last 168 hours. CBC: Recent Labs  Lab 11/17/17 1030 11/18/17 0438  WBC 19.8* 20.9*  NEUTROABS 17.6*  --   HGB 16.3 15.9  HCT 46.7 46.1  MCV 88.8 90.9  PLT 128* 137*   Cardiac Enzymes: Recent Labs  Lab 11/17/17 1658 11/17/17 2253 11/18/17 0438  TROPONINI 0.15* 0.11* 0.08*   BNP: Invalid input(s): POCBNP CBG: Recent Labs  Lab 11/17/17 1741 11/17/17 2208  GLUCAP 158* 169*   HbA1C: Recent Labs    11/17/17 1658  HGBA1C 5.9*   Urine analysis:    Component Value Date/Time   COLORURINE YELLOW 11/17/2017 1530   APPEARANCEUR CLEAR 11/17/2017 1530   LABSPEC >1.046 (H) 11/17/2017 1530   PHURINE 7.0 11/17/2017 1530   GLUCOSEU 150 (A) 11/17/2017 1530   HGBUR NEGATIVE 11/17/2017 1530   BILIRUBINUR NEGATIVE 11/17/2017 1530   KETONESUR NEGATIVE 11/17/2017 1530   PROTEINUR NEGATIVE 11/17/2017 1530    NITRITE NEGATIVE 11/17/2017 1530   LEUKOCYTESUR NEGATIVE 11/17/2017 1530   Sepsis Labs: @LABRCNTIP (procalcitonin:4,lacticidven:4) )No results found for this or any previous visit (from the past 240 hour(s)).   Scheduled Meds: . enoxaparin (LOVENOX) injection  40 mg Subcutaneous Q24H  . insulin aspart  0-5 Units Subcutaneous QHS  . insulin aspart  0-9 Units Subcutaneous TID WC  . metoprolol tartrate  2.5 mg Intravenous Q6H   Continuous Infusions: . sodium chloride Stopped (11/17/17 1753)  . 0.9 % NaCl with KCl 20 mEq / L 100 mL/hr at 11/18/17 0230  . piperacillin-tazobactam (ZOSYN)  IV Stopped (11/18/17 0309)    Procedures/Studies: Dg Chest Portable 1 View  Result Date: 11/17/2017 CLINICAL DATA:  57 year old male with a history of epigastric abdominal pain and nausea EXAM: PORTABLE CHEST 1 VIEW COMPARISON:  None. FINDINGS: Cardiomediastinal silhouette within normal limits. Coarsened interstitial markings bilaterally. Low lung volumes. No confluent airspace disease. No evidence of pleural effusion or pneumothorax. No interlobular septal thickening. Degenerative changes of the bilateral shoulders.  Unremarkable upper abdomen. IMPRESSION: Low lung volumes with likely chronic changes/atelectasis, and no radiographic evidence of acute cardiopulmonary disease. Electronically Signed   By: Gilmer MorJaime  Wagner D.O.   On: 11/17/2017 10:51   Ct Angio Abd/pel W/ And/or W/o  Result Date: 11/17/2017 CLINICAL DATA:  57 year old male with upper abdominal cramping, nausea and vomiting for 1 day. EXAM: CT ANGIOGRAPHY ABDOMEN AND PELVIS WITH CONTRAST AND WITHOUT CONTRAST TECHNIQUE: Multidetector CT imaging of the abdomen and pelvis was performed using the standard protocol during bolus administration of intravenous contrast. Multiplanar reconstructed images and MIPs were obtained and reviewed to evaluate the vascular anatomy. CONTRAST:  100mL ISOVUE-370 IOPAMIDOL (ISOVUE-370) INJECTION 76% COMPARISON:  None.  FINDINGS: VASCULAR Aorta: The aorta is normal in caliber without evidence of aneurysm or dissection. Minimal calcified atherosclerotic plaque. Celiac: Patent without evidence of aneurysm, dissection, vasculitis or significant stenosis. SMA: Patent without evidence of aneurysm, dissection, vasculitis or significant stenosis. Renals: Both renal arteries are patent without evidence of aneurysm, dissection, vasculitis, fibromuscular dysplasia or significant stenosis. IMA: Patent without evidence of aneurysm, dissection, vasculitis or significant stenosis. Inflow: Patent without evidence of aneurysm, dissection, vasculitis or significant stenosis. Proximal Outflow: Bilateral common femoral and visualized portions of the superficial and profunda femoral arteries are patent without evidence of aneurysm, dissection, vasculitis or significant stenosis. Veins: No obvious venous abnormality within the limitations of this arterial phase study. Review of the MIP images confirms the above findings. NON-VASCULAR Lower chest: Trace dependent atelectasis bilaterally. Otherwise, the visualized lower chest is unremarkable. Hepatobiliary: Normal hepatic parenchyma with some arterial hyperenhancement around the gallbladder. No discrete solid lesions. There are a few tiny subcentimeter low-attenuation lesions which are too small to characterize but statistically likely to represent benign cysts. Mild gallbladder wall thickening. Suggestion of tiny punctate echogenic foci in the region of the cystic duct. Small volume fluid extends from the region of the gallbladder neck into the pancreaticoduodenal groove. This may represent pericholecystic fluid. Pancreas: Normal enhancement of the pancreas. No definite pancreatic edema or hypoperfusion. No mass lesion present. Fluid confined to the pancreaticoduodenal groove. Spleen: Normal in size without focal abnormality. Adrenals/Urinary Tract: Normal adrenal glands. Symmetric enhancement of the  kidneys bilaterally. No hydronephrosis, nephrolithiasis or enhancing renal mass. Tiny subcentimeter low-attenuation lesion exophytic from the upper pole of the right kidney is too small to characterize but statistically highly likely a benign cyst. Stomach/Bowel: The stomach is within normal limits. Perhaps mild wall thickening of the second portion of the duodenum which is likely reactive. Normal appendix in the right lower quadrant. The terminal ileum is unremarkable. No evidence of bowel obstruction. Lymphatic: No suspicious lymphadenopathy. Reproductive: Prostate is unremarkable. Other: No abdominal wall hernia or abnormality. No abdominopelvic ascites. Musculoskeletal: No acute fracture or aggressive appearing lytic or blastic osseous lesion. IMPRESSION: VASCULAR 1. No acute arterial abnormality. 2.  Aortic Atherosclerosis (ICD10-170.0). NON-VASCULAR 1. CT findings are most suggestive of acute cholecystitis. There appear to be tiny punctate stones in the region of the gallbladder neck/cystic duct, the gallbladder wall is slightly thickened, there is hyperemia in the hepatic parenchyma adjacent to the gallbladder, and finally there is pericholecystic fluid extending along the porta hepatis into the pancreaticoduodenal groove. Additional differential considerations which are considered less likely include primary duodenitis, or mild pancreatitis. The epicenter appears to be the gallbladder with secondary inflammation involving the pancreaticoduodenal groove. Consider further evaluation with right upper quadrant ultrasound. Electronically Signed   By: Malachy MoanHeath  McCullough M.D.   On: 11/17/2017 15:12    Catarina Hartshornavid Carlette Palmatier, DO  [facility] Pager [phone]  If 7PM-7AM, please contact night-coverage [email or web address] Password [email or web address] 11/18/2017, 7:18 AM   LOS: 1 day

## 2017-11-18 NOTE — Consult Note (Signed)
Reason for Consult: Acute cholecystitis, cholelithiasis Referring Physician: Dr. [name] is an 57 y.o. male.  HPI: Patient is a 57 year old white male who presented emergency room with a several day history of worsening right upper quadrant abdominal pain.  He states this started soon after eating some seafood.  He has had multiple episodes of nausea and emesis.  He denies any fever, chills, jaundice.  He denies any chest pain.  He was seen in the emergency room and noted to be tachycardic.  CT scan of the abdomen revealed possible gallstone in the cystic duct.  He has mild pericholecystic fluid as well as a mildly thickened gallbladder wall.  He was also found in the emergency room to have an elevated troponin with sinus tachycardia.  He was admitted to the hospital for IV antibiotics and serial troponin levels.  Overnight, he did have elevation of his troponin.  Radiology consult is pending.  His liver enzyme tests were within normal limits.  He did have a leukocytosis of 19,000.  This morning, he still has right upper quadrant abdominal pain.  He denies any chest pain.  Past Medical History:  Diagnosis Date  . Hypertension     Past Surgical History:  Procedure Laterality Date  . HERNIA REPAIR      History reviewed. No pertinent family history.  Social History:  reports that he has been smoking.  He has been smoking about 0.50 packs per day. He has never used smokeless tobacco. He reports that he drank alcohol. He reports that he does not use drugs.  Allergies: No Known Allergies  Medications: I have reviewed the patient's current medications.  Results for orders placed or performed during the hospital encounter of 11/17/17 (from the past 48 hour(s))  Lipase, blood     Status: None   Collection Time: 11/17/17 10:30 AM  Result Value Ref Range   Lipase 25 11 - 51 U/L    Comment: Performed at [facility], [address], [place], [place] [address]  Comprehensive metabolic panel      Status: Abnormal   Collection Time: 11/17/17 10:30 AM  Result Value Ref Range   Sodium 136 135 - 145 mmol/L   Potassium 3.4 (L) 3.5 - 5.1 mmol/L   Chloride 102 98 - 111 mmol/L    Comment: Please note change in reference range.   CO2 22 22 - 32 mmol/L   Glucose, Bld 198 (H) 70 - 99 mg/dL    Comment: Please note change in reference range.   BUN 19 6 - 20 mg/dL    Comment: Please note change in reference range.   Creatinine, Ser 0.97 0.61 - 1.24 mg/dL   Calcium 9.1 8.9 - 10.3 mg/dL   Total Protein 7.8 6.5 - 8.1 g/dL   Albumin 4.3 3.5 - 5.0 g/dL   AST 19 15 - 41 U/L   ALT 17 0 - 44 U/L    Comment: Please note change in reference range.   Alkaline Phosphatase 67 38 - 126 U/L   Total Bilirubin 1.2 0.3 - 1.2 mg/dL   GFR calc non Af Amer >60 >60 mL/min   GFR calc Af Amer >60 >60 mL/min    Comment: (NOTE) The eGFR has been calculated using the CKD EPI equation. This calculation has not been validated in all clinical situations. eGFR's persistently <60 mL/min signify possible Chronic Kidney Disease.    Anion gap 12 5 - 15    Comment: Performed at [facility], [address], [place], [place] [address]  CBC with Differential     Status: Abnormal   Collection Time: 11/17/17 10:30 AM  Result Value Ref Range   WBC 19.8 (H) 4.0 - 10.5 K/uL   RBC 5.26 4.22 - 5.81 MIL/uL   Hemoglobin 16.3 13.0 - 17.0 g/dL   HCT 46.7 39.0 - 52.0 %   MCV 88.8 78.0 - 100.0 fL   MCH 31.0 26.0 - 34.0 pg   MCHC 34.9 30.0 - 36.0 g/dL   RDW 13.7 11.5 - 15.5 %   Platelets 128 (L) 150 - 400 K/uL   Neutrophils Relative % 89 %   Neutro Abs 17.6 (H) 1.7 - 7.7 K/uL   Lymphocytes Relative 6 %   Lymphs Abs 1.1 0.7 - 4.0 K/uL   Monocytes Relative 5 %   Monocytes Absolute 1.1 (H) 0.1 - 1.0 K/uL   Eosinophils Relative 0 %   Eosinophils Absolute 0.0 0.0 - 0.7 K/uL   Basophils Relative 0 %   Basophils Absolute 0.0 0.0 - 0.1 K/uL    Comment: Performed at [facility], [address], [place], [place] [address]   I-Stat Troponin, ED (not at [facility])     Status: Abnormal   Collection Time: 11/17/17 11:37 AM  Result Value Ref Range   Troponin i, poc 0.09 (HH) 0.00 - 0.08 ng/mL   Comment MD NOTIFIED, REPEAT TEST    Comment 3            Comment: Due to the release kinetics of cTnI, a negative result within the first hours of the onset of symptoms does not rule out myocardial infarction with certainty. If myocardial infarction is still suspected, repeat the test at appropriate intervals.   Urinalysis, Routine w reflex microscopic     Status: Abnormal   Collection Time: 11/17/17  3:30 PM  Result Value Ref Range   Color, Urine YELLOW YELLOW   APPearance CLEAR CLEAR   Specific Gravity, Urine >1.046 (H) 1.005 - 1.030   pH 7.0 5.0 - 8.0   Glucose, UA 150 (A) NEGATIVE mg/dL   Hgb urine dipstick NEGATIVE NEGATIVE   Bilirubin Urine NEGATIVE NEGATIVE   Ketones, ur NEGATIVE NEGATIVE mg/dL   Protein, ur NEGATIVE NEGATIVE mg/dL   Nitrite NEGATIVE NEGATIVE   Leukocytes, UA NEGATIVE NEGATIVE    Comment: Performed at [facility] [address] [place], [place] [address]  Rapid urine drug screen (hospital performed)     Status: Abnormal   Collection Time: 11/17/17  3:30 PM  Result Value Ref Range   Opiates NONE DETECTED NONE DETECTED   Cocaine NONE DETECTED NONE DETECTED   Benzodiazepines NONE DETECTED NONE DETECTED   Amphetamines NONE DETECTED NONE DETECTED   Tetrahydrocannabinol POSITIVE (A) NONE DETECTED   Barbiturates (A) NONE DETECTED    Result not available. Reagent lot number recalled by manufacturer.    Comment: (NOTE) DRUG SCREEN FOR MEDICAL PURPOSES ONLY.  IF CONFIRMATION IS NEEDED FOR ANY PURPOSE, NOTIFY LAB WITHIN 5 DAYS. LOWEST DETECTABLE LIMITS FOR URINE DRUG SCREEN Drug Class                     Cutoff (ng/mL) Amphetamine and metabolites    1000 Barbiturate and metabolites    200 Benzodiazepine                 680 Tricyclics and metabolites     300 Opiates and metabolites         300 Cocaine and metabolites  300 THC                            50 Performed at [facility], [address], [place], [place] [address]   I-Stat Troponin, ED (not at [facility])     Status: None   Collection Time: 11/17/17  4:51 PM  Result Value Ref Range   Troponin i, poc 0.08 0.00 - 0.08 ng/mL   Comment 3            Comment: Due to the release kinetics of cTnI, a negative result within the first hours of the onset of symptoms does not rule out myocardial infarction with certainty. If myocardial infarction is still suspected, repeat the test at appropriate intervals.   Troponin I     Status: Abnormal   Collection Time: 11/17/17  4:58 PM  Result Value Ref Range   Troponin I 0.15 (HH) <0.03 ng/mL    Comment: CRITICAL RESULT CALLED TO, READ BACK BY AND VERIFIED WITH: HOWERTON,M ON 11/17/17 AT 1815 BY LOY,C Performed at [facility], [address], [place], [place] [address]   Hemoglobin A1c     Status: Abnormal   Collection Time: 11/17/17  4:58 PM  Result Value Ref Range   Hgb A1c MFr Bld 5.9 (H) 4.8 - 5.6 %    Comment: (NOTE) Pre diabetes:          5.7%-6.4% Diabetes:              >6.4% Glycemic control for   <7.0% adults with diabetes    Mean Plasma Glucose 122.63 mg/dL    Comment: Performed at [facility] [address], [place], [place] [address]  Glucose, capillary     Status: Abnormal   Collection Time: 11/17/17  5:41 PM  Result Value Ref Range   Glucose-Capillary 158 (H) 70 - 99 mg/dL  Glucose, capillary     Status: Abnormal   Collection Time: 11/17/17 10:08 PM  Result Value Ref Range   Glucose-Capillary 169 (H) 70 - 99 mg/dL  Troponin I     Status: Abnormal   Collection Time: 11/17/17 10:53 PM  Result Value Ref Range   Troponin I 0.11 (HH) <0.03 ng/mL    Comment: CRITICAL VALUE NOTED.  VALUE IS CONSISTENT WITH PREVIOUSLY REPORTED AND CALLED VALUE. Performed at [facility], [address] [place], [place] [address]   Troponin I     Status: Abnormal    Collection Time: 11/18/17  4:38 AM  Result Value Ref Range   Troponin I 0.08 (HH) <0.03 ng/mL    Comment: CRITICAL VALUE NOTED.  VALUE IS CONSISTENT WITH PREVIOUSLY REPORTED AND CALLED VALUE. Performed at [facility], [address] [place], [place] [address]   Comprehensive metabolic panel     Status: Abnormal   Collection Time: 11/18/17  4:38 AM  Result Value Ref Range   Sodium 134 (L) 135 - 145 mmol/L   Potassium 3.9 3.5 - 5.1 mmol/L   Chloride 102 98 - 111 mmol/L    Comment: Please note change in reference range.   CO2 23 22 - 32 mmol/L   Glucose, Bld 158 (H) 70 - 99 mg/dL    Comment: Please note change in reference range.   BUN 14 6 - 20 mg/dL    Comment: Please note change in reference range.   Creatinine, Ser 0.94 0.61 - 1.24 mg/dL   Calcium 8.5 (L) 8.9 - 10.3 mg/dL  Total Protein 7.1 6.5 - 8.1 g/dL   Albumin 3.8 3.5 - 5.0 g/dL   AST 28 15 - 41 U/L   ALT 25 0 - 44 U/L    Comment: Please note change in reference range.   Alkaline Phosphatase 66 38 - 126 U/L   Total Bilirubin 1.6 (H) 0.3 - 1.2 mg/dL   GFR calc non Af Amer >60 >60 mL/min   GFR calc Af Amer >60 >60 mL/min    Comment: (NOTE) The eGFR has been calculated using the CKD EPI equation. This calculation has not been validated in all clinical situations. eGFR's persistently <60 mL/min signify possible Chronic Kidney Disease.    Anion gap 9 5 - 15    Comment: Performed at [facility], [address] [place], [place] [address]  CBC     Status: Abnormal   Collection Time: 11/18/17  4:38 AM  Result Value Ref Range   WBC 20.9 (H) 4.0 - 10.5 K/uL   RBC 5.07 4.22 - 5.81 MIL/uL   Hemoglobin 15.9 13.0 - 17.0 g/dL   HCT 46.1 39.0 - 52.0 %   MCV 90.9 78.0 - 100.0 fL   MCH 31.4 26.0 - 34.0 pg   MCHC 34.5 30.0 - 36.0 g/dL   RDW 14.3 11.5 - 15.5 %   Platelets 137 (L) 150 - 400 K/uL    Comment: Performed at [facility], [address], [place], [place] [address]  Magnesium     Status: None   Collection Time:  11/18/17  4:38 AM  Result Value Ref Range   Magnesium 2.0 1.7 - 2.4 mg/dL    Comment: Performed at [facility], [address] [place], [place] [address]    Dg Chest Portable 1 View  Result Date: 11/17/2017 CLINICAL DATA:  57 year old male with a history of epigastric abdominal pain and nausea EXAM: PORTABLE CHEST 1 VIEW COMPARISON:  None. FINDINGS: Cardiomediastinal silhouette within normal limits. Coarsened interstitial markings bilaterally. Low lung volumes. No confluent airspace disease. No evidence of pleural effusion or pneumothorax. No interlobular septal thickening. Degenerative changes of the bilateral shoulders. Unremarkable upper abdomen. IMPRESSION: Low lung volumes with likely chronic changes/atelectasis, and no radiographic evidence of acute cardiopulmonary disease. Electronically Signed   By: Corrie Mckusick D.O.   On: 11/17/2017 10:51   Ct Angio Abd/pel W/ And/or W/o  Result Date: 11/17/2017 CLINICAL DATA:  57 year old male with upper abdominal cramping, nausea and vomiting for 1 day. EXAM: CT ANGIOGRAPHY ABDOMEN AND PELVIS WITH CONTRAST AND WITHOUT CONTRAST TECHNIQUE: Multidetector CT imaging of the abdomen and pelvis was performed using the standard protocol during bolus administration of intravenous contrast. Multiplanar reconstructed images and MIPs were obtained and reviewed to evaluate the vascular anatomy. CONTRAST:  123m ISOVUE-370 IOPAMIDOL (ISOVUE-370) INJECTION 76% COMPARISON:  None. FINDINGS: VASCULAR Aorta: The aorta is normal in caliber without evidence of aneurysm or dissection. Minimal calcified atherosclerotic plaque. Celiac: Patent without evidence of aneurysm, dissection, vasculitis or significant stenosis. SMA: Patent without evidence of aneurysm, dissection, vasculitis or significant stenosis. Renals: Both renal arteries are patent without evidence of aneurysm, dissection, vasculitis, fibromuscular dysplasia or significant stenosis. IMA: Patent without evidence of  aneurysm, dissection, vasculitis or significant stenosis. Inflow: Patent without evidence of aneurysm, dissection, vasculitis or significant stenosis. Proximal Outflow: Bilateral common femoral and visualized portions of the superficial and profunda femoral arteries are patent without evidence of aneurysm, dissection, vasculitis or significant stenosis. Veins: No obvious venous abnormality within the limitations of this arterial phase study. Review of the  MIP images confirms the above findings. NON-VASCULAR Lower chest: Trace dependent atelectasis bilaterally. Otherwise, the visualized lower chest is unremarkable. Hepatobiliary: Normal hepatic parenchyma with some arterial hyperenhancement around the gallbladder. No discrete solid lesions. There are a few tiny subcentimeter low-attenuation lesions which are too small to characterize but statistically likely to represent benign cysts. Mild gallbladder wall thickening. Suggestion of tiny punctate echogenic foci in the region of the cystic duct. Small volume fluid extends from the region of the gallbladder neck into the pancreaticoduodenal groove. This may represent pericholecystic fluid. Pancreas: Normal enhancement of the pancreas. No definite pancreatic edema or hypoperfusion. No mass lesion present. Fluid confined to the pancreaticoduodenal groove. Spleen: Normal in size without focal abnormality. Adrenals/Urinary Tract: Normal adrenal glands. Symmetric enhancement of the kidneys bilaterally. No hydronephrosis, nephrolithiasis or enhancing renal mass. Tiny subcentimeter low-attenuation lesion exophytic from the upper pole of the right kidney is too small to characterize but statistically highly likely a benign cyst. Stomach/Bowel: The stomach is within normal limits. Perhaps mild wall thickening of the second portion of the duodenum which is likely reactive. Normal appendix in the right lower quadrant. The terminal ileum is unremarkable. No evidence of bowel  obstruction. Lymphatic: No suspicious lymphadenopathy. Reproductive: Prostate is unremarkable. Other: No abdominal wall hernia or abnormality. No abdominopelvic ascites. Musculoskeletal: No acute fracture or aggressive appearing lytic or blastic osseous lesion. IMPRESSION: VASCULAR 1. No acute arterial abnormality. 2.  Aortic Atherosclerosis (ICD10-170.0). NON-VASCULAR 1. CT findings are most suggestive of acute cholecystitis. There appear to be tiny punctate stones in the region of the gallbladder neck/cystic duct, the gallbladder wall is slightly thickened, there is hyperemia in the hepatic parenchyma adjacent to the gallbladder, and finally there is pericholecystic fluid extending along the porta hepatis into the pancreaticoduodenal groove. Additional differential considerations which are considered less likely include primary duodenitis, or mild pancreatitis. The epicenter appears to be the gallbladder with secondary inflammation involving the pancreaticoduodenal groove. Consider further evaluation with right upper quadrant ultrasound. Electronically Signed   By: Jacqulynn Cadet M.D.   On: 11/17/2017 15:12    ROS:  Pertinent items are noted in HPI.  Blood pressure (!) 144/89, pulse (!) 113, temperature 99.4 F (37.4 C), temperature source Axillary, resp. rate 18, height 6' 1"  (1.854 m), weight 216 lb 0.8 oz (98 kg), SpO2 97 %. Physical Exam: Well-developed and well-nourished white male in moderate distress secondary to pain Head is normocephalic, atraumatic Eyes are without scleral icterus Lungs are clear to auscultation with equal breath sounds bilaterally Heart examination reveals a tachycardic rhythm which is regular.  No S3, S4, murmurs noted Abdomen is soft with extreme tenderness to palpation in the right upper quadrant.  No hepatosplenomegaly, masses, hernias are noted.  CT scan images personally reviewed.  Labs reviewed.  Assessment/Plan: Impression: Acute cholecystitis,  cholelithiasis.  Elevated troponins possibly secondary to stress.  Cardiology consult pending plan: Will schedule laparoscopic cholecystectomy pending cardiology consultation.  The risks and benefits of the procedure including bleeding, infection, hepatobiliary injury, and the possibility of an open procedure were fully explained to the patient, who gave informed consent.  Would continue IV Zosyn for now.  Blake Juarez 11/18/2017, 7:22 AM

## 2017-11-18 NOTE — Consult Note (Addendum)
Cardiology Consult    Patient ID: Blake Juarez; [id number]; 08-21-1960   Admit date: 11/17/2017 Date of Consult: 11/18/2017  Primary Care Provider: Patient, No Pcp Per Primary Cardiologist: New to [facility] - Dr. Harl Bowie  Patient Profile    Blake Juarez is a 57 y.o. male with past medical history of HTN, tobacco use, and no prior cardiac history who is being seen today for the evaluation of preoperative cardiac clearance at the request of Dr. Arnoldo Morale.  History of Present Illness    Blake Juarez presented to [facility] ED on 11/17/2017 for evaluation of abdominal discomfort, nausea, and vomiting.  Initial labs show WBC 19.8, Hgb 16.3, platelets 128, Na+ 136, K+ 3.4, and creatinine 0.97. AST 19, ALT 17, and Alk Phos 67. UDS positive for THC. Initial and cyclic troponin values have been flat at 0.09, 0.08, 0.15, 0.11, and 0.08. EKG shows sinus tachycardia, HR 106, with no diagnostic ST abnormalities. CXR shows low lung volumes and no acute cardiopulmonary disease. CT abdomen showing tiny punctate stones in the region of the gallbladder neck/cystic duct with the gallbladder wall being slightly thickened and findings most consistent with acute cholecystitis.  He has been admitted and started on IV Zosyn. General Surgery has been consulted and are planning for a laparoscopic cholecystectomy later this admission.   He denies any recent chest pain or SOB. Remains physically active, regularly doing heavy yardwork. 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 Reports he can walk up several flights of stairs without limitation, can walk over 4 blocks on flat ground without limitation.     Past Medical History:  Diagnosis Date  . Hypertension     Past Surgical History:  Procedure Laterality Date  . HERNIA REPAIR       Home Medications:  Prior to Admission medications   Medication Sig Start Date End Date Taking? Authorizing Provider  metoprolol tartrate (LOPRESSOR) 50 MG  tablet Take 50 mg by mouth 2 (two) times daily. 08/29/17  Yes [provider]    Inpatient Medications: Scheduled Meds: . enoxaparin (LOVENOX) injection  40 mg Subcutaneous Q24H  . insulin aspart  0-5 Units Subcutaneous QHS  . insulin aspart  0-9 Units Subcutaneous TID WC  . metoprolol tartrate  50 mg Oral BID   Continuous Infusions: . sodium chloride Stopped (11/17/17 1753)  . 0.9 % NaCl with KCl 20 mEq / L 100 mL/hr at 11/18/17 0230  . piperacillin-tazobactam (ZOSYN)  IV 3.375 g (11/18/17 0836)   PRN Meds: acetaminophen **OR** acetaminophen, morphine injection, ondansetron **OR** ondansetron (ZOFRAN) IV  Allergies:   No Known Allergies  Social History:   Social History   Socioeconomic History  . Marital status: Single    Spouse name: Not on file  . Number of children: Not on file  . Years of education: Not on file  . Highest education level: Not on file  Occupational History  . Not on file  Social Needs  . Financial resource strain: Not on file  . Food insecurity:    Worry: Not on file    Inability: Not on file  . Transportation needs:    Medical: Not on file    Non-medical: Not on file  Tobacco Use  . Smoking status: Current Every Day Smoker    Packs/day: 0.50  . Smokeless tobacco: Never Used  Substance and Sexual Activity  . Alcohol use: Not Currently  . Drug use: Never  . Sexual activity: Not Currently  Lifestyle  .  Physical activity:    Days per week: Not on file    Minutes per session: Not on file  . Stress: Not on file  Relationships  . Social connections:    Talks on phone: Not on file    Gets together: Not on file    Attends religious service: Not on file    Active member of club or organization: Not on file    Attends meetings of clubs or organizations: Not on file    Relationship status: Not on file  . Intimate partner violence:    Fear of current or ex partner: Not on file    Emotionally abused: Not on file    Physically abused: Not  on file    Forced sexual activity: Not on file  Other Topics Concern  . Not on file  Social History Narrative  . Not on file     Family History:   Multiple family members with CAD.    Review of Systems    General:  No chills, fever, night sweats or weight changes.  Cardiovascular:  No chest pain, dyspnea on exertion, edema, orthopnea, palpitations, paroxysmal nocturnal dyspnea. Dermatological: No rash, lesions/masses Respiratory: No cough, dyspnea Urologic: No hematuria, dysuria Abdominal:   No diarrhea, bright red blood per rectum, melena, or hematemesis. Positive for abdominal pain, nausea, and vomiting.  Neurologic:  No visual changes, wkns, changes in mental status. All other systems reviewed and are otherwise negative except as noted above.  Physical Exam/Data    Vitals:   11/17/17 2145 11/17/17 2218 11/17/17 2300 11/18/17 0656  BP: (!) 180/102 (!) 160/90 (!) 150/92 (!) 144/89  Pulse:    (!) 113  Resp:      Temp:    99.4 F (37.4 C)  TempSrc:    Axillary  SpO2:    97%  Weight:      Height:        Intake/Output Summary (Last 24 hours) at 11/18/2017 0837 Last data filed at 11/18/2017 0400 Gross per 24 hour  Intake 1073.97 ml  Output -  Net 1073.97 ml   Filed Weights   11/17/17 1003 11/17/17 1720  Weight: 215 lb (97.5 kg) 216 lb 0.8 oz (98 kg)   Body mass index is 28.5 kg/m.   General: Pleasant, NAD Psych: Normal affect. Neuro: Alert and oriented X 3. Moves all extremities spontaneously. HEENT: Normal  Neck: Supple without bruits or JVD. Lungs:  Resp regular and unlabored, CTA without wheezing or rales. Heart: RRR no s3, s4, or murmurs. Abdomen: soft, tender throughout Extremities: No clubbing, cyanosis or edema. DP/PT/Radials 2+ and equal bilaterally.   EKG:  The EKG was personally reviewed and demonstrates: Sinus tachycardia, HR 106, with no diagnostic ST abnormalities.   Telemetry:  Telemetry was personally reviewed and demonstrates: Sinus  tachycardia, HR in low-100's to 110's.    Labs/Studies     Relevant CV Studies:  Echocardiogram: Pending  Laboratory Data:  Chemistry Recent Labs  Lab 11/17/17 1030 11/18/17 0438  NA 136 134*  K 3.4* 3.9  CL 102 102  CO2 22 23  GLUCOSE 198* 158*  BUN 19 14  CREATININE 0.97 0.94  CALCIUM 9.1 8.5*  GFRNONAA >60 >60  GFRAA >60 >60  ANIONGAP 12 9    Recent Labs  Lab 11/17/17 1030 11/18/17 0438  PROT 7.8 7.1  ALBUMIN 4.3 3.8  AST 19 28  ALT 17 25  ALKPHOS 67 66  BILITOT 1.2 1.6*   Hematology Recent Labs  Lab 11/17/17  1030 11/18/17 0438  WBC 19.8* 20.9*  RBC 5.26 5.07  HGB 16.3 15.9  HCT 46.7 46.1  MCV 88.8 90.9  MCH 31.0 31.4  MCHC 34.9 34.5  RDW 13.7 14.3  PLT 128* 137*   Cardiac Enzymes Recent Labs  Lab 11/17/17 1658 11/17/17 2253 11/18/17 0438  TROPONINI 0.15* 0.11* 0.08*    Recent Labs  Lab 11/17/17 1137 11/17/17 1651  TROPIPOC 0.09* 0.08    BNPNo results for input(s): BNP, PROBNP in the last 168 hours.  DDimer No results for input(s): DDIMER in the last 168 hours.  Radiology/Studies:  Dg Chest Portable 1 View  Result Date: 11/17/2017 CLINICAL DATA:  57 year old male with a history of epigastric abdominal pain and nausea EXAM: PORTABLE CHEST 1 VIEW COMPARISON:  None. FINDINGS: Cardiomediastinal silhouette within normal limits. Coarsened interstitial markings bilaterally. Low lung volumes. No confluent airspace disease. No evidence of pleural effusion or pneumothorax. No interlobular septal thickening. Degenerative changes of the bilateral shoulders. Unremarkable upper abdomen. IMPRESSION: Low lung volumes with likely chronic changes/atelectasis, and no radiographic evidence of acute cardiopulmonary disease. Electronically Signed   By: Corrie Mckusick D.O.   On: 11/17/2017 10:51   Ct Angio Abd/pel W/ And/or W/o  Result Date: 11/17/2017 CLINICAL DATA:  57 year old male with upper abdominal cramping, nausea and vomiting for 1 day. EXAM: CT  ANGIOGRAPHY ABDOMEN AND PELVIS WITH CONTRAST AND WITHOUT CONTRAST TECHNIQUE: Multidetector CT imaging of the abdomen and pelvis was performed using the standard protocol during bolus administration of intravenous contrast. Multiplanar reconstructed images and MIPs were obtained and reviewed to evaluate the vascular anatomy. CONTRAST:  153m ISOVUE-370 IOPAMIDOL (ISOVUE-370) INJECTION 76% COMPARISON:  None. FINDINGS: VASCULAR Aorta: The aorta is normal in caliber without evidence of aneurysm or dissection. Minimal calcified atherosclerotic plaque. Celiac: Patent without evidence of aneurysm, dissection, vasculitis or significant stenosis. SMA: Patent without evidence of aneurysm, dissection, vasculitis or significant stenosis. Renals: Both renal arteries are patent without evidence of aneurysm, dissection, vasculitis, fibromuscular dysplasia or significant stenosis. IMA: Patent without evidence of aneurysm, dissection, vasculitis or significant stenosis. Inflow: Patent without evidence of aneurysm, dissection, vasculitis or significant stenosis. Proximal Outflow: Bilateral common femoral and visualized portions of the superficial and profunda femoral arteries are patent without evidence of aneurysm, dissection, vasculitis or significant stenosis. Veins: No obvious venous abnormality within the limitations of this arterial phase study. Review of the MIP images confirms the above findings. NON-VASCULAR Lower chest: Trace dependent atelectasis bilaterally. Otherwise, the visualized lower chest is unremarkable. Hepatobiliary: Normal hepatic parenchyma with some arterial hyperenhancement around the gallbladder. No discrete solid lesions. There are a few tiny subcentimeter low-attenuation lesions which are too small to characterize but statistically likely to represent benign cysts. Mild gallbladder wall thickening. Suggestion of tiny punctate echogenic foci in the region of the cystic duct. Small volume fluid extends from  the region of the gallbladder neck into the pancreaticoduodenal groove. This may represent pericholecystic fluid. Pancreas: Normal enhancement of the pancreas. No definite pancreatic edema or hypoperfusion. No mass lesion present. Fluid confined to the pancreaticoduodenal groove. Spleen: Normal in size without focal abnormality. Adrenals/Urinary Tract: Normal adrenal glands. Symmetric enhancement of the kidneys bilaterally. No hydronephrosis, nephrolithiasis or enhancing renal mass. Tiny subcentimeter low-attenuation lesion exophytic from the upper pole of the right kidney is too small to characterize but statistically highly likely a benign cyst. Stomach/Bowel: The stomach is within normal limits. Perhaps mild wall thickening of the second portion of the duodenum which is likely reactive. Normal appendix in the right  lower quadrant. The terminal ileum is unremarkable. No evidence of bowel obstruction. Lymphatic: No suspicious lymphadenopathy. Reproductive: Prostate is unremarkable. Other: No abdominal wall hernia or abnormality. No abdominopelvic ascites. Musculoskeletal: No acute fracture or aggressive appearing lytic or blastic osseous lesion. IMPRESSION: VASCULAR 1. No acute arterial abnormality. 2.  Aortic Atherosclerosis (ICD10-170.0). NON-VASCULAR 1. CT findings are most suggestive of acute cholecystitis. There appear to be tiny punctate stones in the region of the gallbladder neck/cystic duct, the gallbladder wall is slightly thickened, there is hyperemia in the hepatic parenchyma adjacent to the gallbladder, and finally there is pericholecystic fluid extending along the porta hepatis into the pancreaticoduodenal groove. Additional differential considerations which are considered less likely include primary duodenitis, or mild pancreatitis. The epicenter appears to be the gallbladder with secondary inflammation involving the pancreaticoduodenal groove. Consider further evaluation with right upper quadrant  ultrasound. Electronically Signed   By: Jacqulynn Cadet M.D.   On: 11/17/2017 15:12     Assessment & Plan    1. Preoperative Cardiac Clearance for Laparoscopic Cholecystectomy - tolerates greater than 4 METs on a regular basis without limitation - mild trop trending down in setting of severe HTN on presentation and acute cholecystitis. No cardiopulmonary symptoms, no specific EKG changes - would anticipate clearing for surgery unless significant abnormality on echo  2. Elevated Troponin - cyclic troponin values have been flat at 0.09, 0.08, 0.15, 0.11, and 0.08. EKG shows sinus tachycardia, HR 106, with no diagnostic ST abnormalities. Flat enzyme trend is most consistent with demand ischemia in the setting of his acute cholecystitis and does not represent ACS.  - an echocardiogram is pending to assess LV function and wall motion.   3. HTN - BP has been variable at 127/76 - 188/110 within the past 24 hours, improved to 144/89 on most recent check. - he has been restarted on PTA Lopressor 57m BID.  4. Acute Cholecystitis - WBC elevated to 20.9. LFT's has been WNL. - has been started on Zosyn. General Surgery planning for a lap cholecystectomy later this admission as outlined above.     For questions or updates, please contact [place] consult [email or web address] for contact info under Cardiology/STEMI.  Signed, BErma Heritage PA-C 11/18/2017, 8:37 AM Pager: [phone]  Patient seen and discussed with PA SAhmed Prima I agree with her documentation above. 58yo male history of HTN and tobacco abuse admitted with acute cholecystitis. He denies any chest pain, no SOB or DOE.    ER vitals: 108 bp 188/110 93% RA Lipase 25 K 3.4 BUN 19 Cr 0.97 WBC 19.8 Hgb 16.3 Plt 128  Trop 0.09--> 0.08-->0.15-->0.11-->0.08-->   Mild trop peak 0.15 in setting of severe HTN on presentation as well as acute cholecystitis.  CXR no acute process CT A/P: acute cholecysitits EKG SR, no  ischemic changes   Mild trop in absence of any cardiopulmonary clinical context. Probable demand ischemia due to his cholecystitis, he also presented with severe HTN. No known cardiac history. Tolerates greater than 4 METs regularly without limitation. Unless significant abnormality on echo would plan for surgery.   JCarlyle DollyMD

## 2017-11-18 NOTE — H&P (View-Only) (Signed)
Reason for Consult: Acute cholecystitis, cholelithiasis Referring Physician: Dr. Lubertha Sayres is an 57 y.o. Juarez.  HPI: Patient is a Blake Juarez who presented emergency room with a several day history of worsening right upper quadrant abdominal pain.  He states this started soon after eating some seafood.  He has had multiple episodes of nausea and emesis.  He denies any fever, chills, jaundice.  He denies any chest pain.  He was seen in the emergency room and noted to be tachycardic.  CT scan of the abdomen revealed possible gallstone in the cystic duct.  He has mild pericholecystic fluid as well as a mildly thickened gallbladder wall.  He was also found in the emergency room to have an elevated troponin with sinus tachycardia.  He was admitted to the hospital for IV antibiotics and serial troponin levels.  Overnight, he did have elevation of his troponin.  Radiology consult is pending.  His liver enzyme tests were within normal limits.  He did have a leukocytosis of 19,000.  This morning, he still has right upper quadrant abdominal pain.  He denies any chest pain.  Past Medical History:  Diagnosis Date  . Hypertension     Past Surgical History:  Procedure Laterality Date  . HERNIA REPAIR      History reviewed. No pertinent family history.  Social History:  reports that he has been smoking.  He has been smoking about 0.50 packs per day. He has never used smokeless tobacco. He reports that he drank alcohol. He reports that he does not use drugs.  Allergies: No Known Allergies  Medications: I have reviewed the patient's current medications.  Results for orders placed or performed during the hospital encounter of 11/17/17 (from the past 48 hour(s))  Lipase, blood     Status: None   Collection Time: 11/17/17 10:30 AM  Result Value Ref Range   Lipase 25 11 - 51 U/L    Comment: Performed at [facility], [address], [place], [place] [address]  Comprehensive metabolic panel      Status: Abnormal   Collection Time: 11/17/17 10:30 AM  Result Value Ref Range   Sodium 136 135 - 145 mmol/L   Potassium 3.4 (L) 3.5 - 5.1 mmol/L   Chloride 102 98 - 111 mmol/L    Comment: Please note change in reference range.   CO2 22 22 - 32 mmol/L   Glucose, Bld 198 (H) 70 - 99 mg/dL    Comment: Please note change in reference range.   BUN 19 6 - 20 mg/dL    Comment: Please note change in reference range.   Creatinine, Ser 0.97 0.61 - 1.24 mg/dL   Calcium 9.1 8.9 - 10.3 mg/dL   Total Protein 7.8 6.5 - 8.1 g/dL   Albumin 4.3 3.5 - 5.0 g/dL   AST 19 15 - 41 U/L   ALT Blake 0 - 44 U/L    Comment: Please note change in reference range.   Alkaline Phosphatase 67 38 - 126 U/L   Total Bilirubin 1.2 0.3 - 1.2 mg/dL   GFR calc non Af Amer >60 >60 mL/min   GFR calc Af Amer >60 >60 mL/min    Comment: (NOTE) The eGFR has been calculated using the CKD EPI equation. This calculation has not been validated in all clinical situations. eGFR's persistently <60 mL/min signify possible Chronic Kidney Disease.    Anion gap 12 5 - 15    Comment: Performed at [facility], [address] [place], [place] [address]  CBC with Differential     Status: Abnormal   Collection Time: 11/17/17 10:30 AM  Result Value Ref Range   WBC 19.8 (H) 4.0 - 10.5 K/uL   RBC 5.26 4.22 - 5.81 MIL/uL   Hemoglobin 16.3 13.0 - Blake.0 g/dL   HCT 46.7 39.0 - 52.0 %   MCV 88.8 78.0 - 100.0 fL   MCH 31.0 26.0 - 34.0 pg   MCHC 34.9 30.0 - 36.0 g/dL   RDW 13.7 11.5 - 15.5 %   Platelets 128 (L) 150 - 400 K/uL   Neutrophils Relative % 89 %   Neutro Abs Blake.6 (H) 1.7 - 7.7 K/uL   Lymphocytes Relative 6 %   Lymphs Abs 1.1 0.7 - 4.0 K/uL   Monocytes Relative 5 %   Monocytes Absolute 1.1 (H) 0.1 - 1.0 K/uL   Eosinophils Relative 0 %   Eosinophils Absolute 0.0 0.0 - 0.7 K/uL   Basophils Relative 0 %   Basophils Absolute 0.0 0.0 - 0.1 K/uL    Comment: Performed at [facility], [address] [place], [place] [address]   I-Stat Troponin, ED (not at [facility])     Status: Abnormal   Collection Time: 11/17/17 11:37 AM  Result Value Ref Range   Troponin i, poc 0.09 (HH) 0.00 - 0.08 ng/mL   Comment MD NOTIFIED, REPEAT TEST    Comment 3            Comment: Due to the release kinetics of cTnI, a negative result within the first hours of the onset of symptoms does not rule out myocardial infarction with certainty. If myocardial infarction is still suspected, repeat the test at appropriate intervals.   Urinalysis, Routine w reflex microscopic     Status: Abnormal   Collection Time: 11/17/17  3:30 PM  Result Value Ref Range   Color, Urine YELLOW YELLOW   APPearance CLEAR CLEAR   Specific Gravity, Urine >1.046 (H) 1.005 - 1.030   pH 7.0 5.0 - 8.0   Glucose, UA 150 (A) NEGATIVE mg/dL   Hgb urine dipstick NEGATIVE NEGATIVE   Bilirubin Urine NEGATIVE NEGATIVE   Ketones, ur NEGATIVE NEGATIVE mg/dL   Protein, ur NEGATIVE NEGATIVE mg/dL   Nitrite NEGATIVE NEGATIVE   Leukocytes, UA NEGATIVE NEGATIVE    Comment: Performed at [facility], [address], [place], [place] [address]  Rapid urine drug screen (hospital performed)     Status: Abnormal   Collection Time: 11/17/17  3:30 PM  Result Value Ref Range   Opiates NONE DETECTED NONE DETECTED   Cocaine NONE DETECTED NONE DETECTED   Benzodiazepines NONE DETECTED NONE DETECTED   Amphetamines NONE DETECTED NONE DETECTED   Tetrahydrocannabinol POSITIVE (A) NONE DETECTED   Barbiturates (A) NONE DETECTED    Result not available. Reagent lot number recalled by manufacturer.    Comment: (NOTE) DRUG SCREEN FOR MEDICAL PURPOSES ONLY.  IF CONFIRMATION IS NEEDED FOR ANY PURPOSE, NOTIFY LAB WITHIN 5 DAYS. LOWEST DETECTABLE LIMITS FOR URINE DRUG SCREEN Drug Class                     Cutoff (ng/mL) Amphetamine and metabolites    1000 Barbiturate and metabolites    200 Benzodiazepine                 607 Tricyclics and metabolites     300 Opiates and metabolites         300 Cocaine and metabolites  300 THC                            50 Performed at [facility], [address], [place], [place] [address]   I-Stat Troponin, ED (not at [facility])     Status: None   Collection Time: 11/17/17  4:51 PM  Result Value Ref Range   Troponin i, poc 0.08 0.00 - 0.08 ng/mL   Comment 3            Comment: Due to the release kinetics of cTnI, a negative result within the first hours of the onset of symptoms does not rule out myocardial infarction with certainty. If myocardial infarction is still suspected, repeat the test at appropriate intervals.   Troponin I     Status: Abnormal   Collection Time: 11/17/17  4:58 PM  Result Value Ref Range   Troponin I 0.15 (HH) <0.03 ng/mL    Comment: CRITICAL RESULT CALLED TO, READ BACK BY AND VERIFIED WITH: HOWERTON,M ON 11/17/17 AT 1815 BY LOY,C Performed at [facility], [address] [place], [place] [address]   Hemoglobin A1c     Status: Abnormal   Collection Time: 11/17/17  4:58 PM  Result Value Ref Range   Hgb A1c MFr Bld 5.9 (H) 4.8 - 5.6 %    Comment: (NOTE) Pre diabetes:          5.7%-6.4% Diabetes:              >6.4% Glycemic control for   <7.0% adults with diabetes    Mean Plasma Glucose 122.63 mg/dL    Comment: Performed at [facility] [address] [place], [place] [address]  Glucose, capillary     Status: Abnormal   Collection Time: 11/17/17  5:41 PM  Result Value Ref Range   Glucose-Capillary 158 (H) 70 - 99 mg/dL  Glucose, capillary     Status: Abnormal   Collection Time: 11/17/17 10:08 PM  Result Value Ref Range   Glucose-Capillary 169 (H) 70 - 99 mg/dL  Troponin I     Status: Abnormal   Collection Time: 11/17/17 10:53 PM  Result Value Ref Range   Troponin I 0.11 (HH) <0.03 ng/mL    Comment: CRITICAL VALUE NOTED.  VALUE IS CONSISTENT WITH PREVIOUSLY REPORTED AND CALLED VALUE. Performed at [facility], [address] [place], [place] [address]   Troponin I     Status: Abnormal    Collection Time: 11/18/17  4:38 AM  Result Value Ref Range   Troponin I 0.08 (HH) <0.03 ng/mL    Comment: CRITICAL VALUE NOTED.  VALUE IS CONSISTENT WITH PREVIOUSLY REPORTED AND CALLED VALUE. Performed at [facility], [address] [place], [place] [address]   Comprehensive metabolic panel     Status: Abnormal   Collection Time: 11/18/17  4:38 AM  Result Value Ref Range   Sodium 134 (L) 135 - 145 mmol/L   Potassium 3.9 3.5 - 5.1 mmol/L   Chloride 102 98 - 111 mmol/L    Comment: Please note change in reference range.   CO2 23 22 - 32 mmol/L   Glucose, Bld 158 (H) 70 - 99 mg/dL    Comment: Please note change in reference range.   BUN 14 6 - 20 mg/dL    Comment: Please note change in reference range.   Creatinine, Ser 0.94 0.61 - 1.24 mg/dL   Calcium 8.5 (L) 8.9 - 10.3 mg/dL  Total Protein 7.1 6.5 - 8.1 g/dL   Albumin 3.8 3.5 - 5.0 g/dL   AST 28 15 - 41 U/L   ALT 25 0 - 44 U/L    Comment: Please note change in reference range.   Alkaline Phosphatase 66 38 - 126 U/L   Total Bilirubin 1.6 (H) 0.3 - 1.2 mg/dL   GFR calc non Af Amer >60 >60 mL/min   GFR calc Af Amer >60 >60 mL/min    Comment: (NOTE) The eGFR has been calculated using the CKD EPI equation. This calculation has not been validated in all clinical situations. eGFR's persistently <60 mL/min signify possible Chronic Kidney Disease.    Anion gap 9 5 - 15    Comment: Performed at [facility], [address], [place], [place] [address]  CBC     Status: Abnormal   Collection Time: 11/18/17  4:38 AM  Result Value Ref Range   WBC 20.9 (H) 4.0 - 10.5 K/uL   RBC 5.07 4.22 - 5.81 MIL/uL   Hemoglobin 15.9 13.0 - Blake.0 g/dL   HCT 46.1 39.0 - 52.0 %   MCV 90.9 78.0 - 100.0 fL   MCH 31.4 26.0 - 34.0 pg   MCHC 34.5 30.0 - 36.0 g/dL   RDW 14.3 11.5 - 15.5 %   Platelets 137 (L) 150 - 400 K/uL    Comment: Performed at [facility], [address], [place], [place] [address]  Magnesium     Status: None   Collection Time:  11/18/17  4:38 AM  Result Value Ref Range   Magnesium 2.0 1.7 - 2.4 mg/dL    Comment: Performed at [facility], [address], [place], [place] [address]    Dg Chest Portable 1 View  Result Date: 11/17/2017 CLINICAL DATA:  57 year old Juarez with a history of epigastric abdominal pain and nausea EXAM: PORTABLE CHEST 1 VIEW COMPARISON:  None. FINDINGS: Cardiomediastinal silhouette within normal limits. Coarsened interstitial markings bilaterally. Low lung volumes. No confluent airspace disease. No evidence of pleural effusion or pneumothorax. No interlobular septal thickening. Degenerative changes of the bilateral shoulders. Unremarkable upper abdomen. IMPRESSION: Low lung volumes with likely chronic changes/atelectasis, and no radiographic evidence of acute cardiopulmonary disease. Electronically Signed   By: Corrie Mckusick D.O.   On: 11/17/2017 10:51   Ct Angio Abd/pel W/ And/or W/o  Result Date: 11/17/2017 CLINICAL DATA:  57 year old Juarez with upper abdominal cramping, nausea and vomiting for 1 day. EXAM: CT ANGIOGRAPHY ABDOMEN AND PELVIS WITH CONTRAST AND WITHOUT CONTRAST TECHNIQUE: Multidetector CT imaging of the abdomen and pelvis was performed using the standard protocol during bolus administration of intravenous contrast. Multiplanar reconstructed images and MIPs were obtained and reviewed to evaluate the vascular anatomy. CONTRAST:  112m ISOVUE-370 IOPAMIDOL (ISOVUE-370) INJECTION 76% COMPARISON:  None. FINDINGS: VASCULAR Aorta: The aorta is normal in caliber without evidence of aneurysm or dissection. Minimal calcified atherosclerotic plaque. Celiac: Patent without evidence of aneurysm, dissection, vasculitis or significant stenosis. SMA: Patent without evidence of aneurysm, dissection, vasculitis or significant stenosis. Renals: Both renal arteries are patent without evidence of aneurysm, dissection, vasculitis, fibromuscular dysplasia or significant stenosis. IMA: Patent without evidence of  aneurysm, dissection, vasculitis or significant stenosis. Inflow: Patent without evidence of aneurysm, dissection, vasculitis or significant stenosis. Proximal Outflow: Bilateral common femoral and visualized portions of the superficial and profunda femoral arteries are patent without evidence of aneurysm, dissection, vasculitis or significant stenosis. Veins: No obvious venous abnormality within the limitations of this arterial phase study. Review of the  MIP images confirms the above findings. NON-VASCULAR Lower chest: Trace dependent atelectasis bilaterally. Otherwise, the visualized lower chest is unremarkable. Hepatobiliary: Normal hepatic parenchyma with some arterial hyperenhancement around the gallbladder. No discrete solid lesions. There are a few tiny subcentimeter low-attenuation lesions which are too small to characterize but statistically likely to represent benign cysts. Mild gallbladder wall thickening. Suggestion of tiny punctate echogenic foci in the region of the cystic duct. Small volume fluid extends from the region of the gallbladder neck into the pancreaticoduodenal groove. This may represent pericholecystic fluid. Pancreas: Normal enhancement of the pancreas. No definite pancreatic edema or hypoperfusion. No mass lesion present. Fluid confined to the pancreaticoduodenal groove. Spleen: Normal in size without focal abnormality. Adrenals/Urinary Tract: Normal adrenal glands. Symmetric enhancement of the kidneys bilaterally. No hydronephrosis, nephrolithiasis or enhancing renal mass. Tiny subcentimeter low-attenuation lesion exophytic from the upper pole of the right kidney is too small to characterize but statistically highly likely a benign cyst. Stomach/Bowel: The stomach is within normal limits. Perhaps mild wall thickening of the second portion of the duodenum which is likely reactive. Normal appendix in the right lower quadrant. The terminal ileum is unremarkable. No evidence of bowel  obstruction. Lymphatic: No suspicious lymphadenopathy. Reproductive: Prostate is unremarkable. Other: No abdominal wall hernia or abnormality. No abdominopelvic ascites. Musculoskeletal: No acute fracture or aggressive appearing lytic or blastic osseous lesion. IMPRESSION: VASCULAR 1. No acute arterial abnormality. 2.  Aortic Atherosclerosis (ICD10-170.0). NON-VASCULAR 1. CT findings are most suggestive of acute cholecystitis. There appear to be tiny punctate stones in the region of the gallbladder neck/cystic duct, the gallbladder wall is slightly thickened, there is hyperemia in the hepatic parenchyma adjacent to the gallbladder, and finally there is pericholecystic fluid extending along the porta hepatis into the pancreaticoduodenal groove. Additional differential considerations which are considered less likely include primary duodenitis, or mild pancreatitis. The epicenter appears to be the gallbladder with secondary inflammation involving the pancreaticoduodenal groove. Consider further evaluation with right upper quadrant ultrasound. Electronically Signed   By: Jacqulynn Cadet M.D.   On: 11/17/2017 15:12    ROS:  Pertinent items are noted in HPI.  Blood pressure (!) 144/89, pulse (!) 113, temperature 99.4 F (37.4 C), temperature source Axillary, resp. rate 18, height 6' 1"  (1.854 m), weight 216 lb 0.8 oz (98 kg), SpO2 97 %. Physical Exam: Well-developed and well-nourished white Juarez in moderate distress secondary to pain Head is normocephalic, atraumatic Eyes are without scleral icterus Lungs are clear to auscultation with equal breath sounds bilaterally Heart examination reveals a tachycardic rhythm which is regular.  No S3, S4, murmurs noted Abdomen is soft with extreme tenderness to palpation in the right upper quadrant.  No hepatosplenomegaly, masses, hernias are noted.  CT scan images personally reviewed.  Labs reviewed.  Assessment/Plan: Impression: Acute cholecystitis,  cholelithiasis.  Elevated troponins possibly secondary to stress.  Cardiology consult pending plan: Will schedule laparoscopic cholecystectomy pending cardiology consultation.  The risks and benefits of the procedure including bleeding, infection, hepatobiliary injury, and the possibility of an open procedure were fully explained to the patient, who gave informed consent.  Would continue IV Zosyn for now.  Aviva Signs 11/18/2017, 7:22 AM

## 2017-11-19 ENCOUNTER — Inpatient Hospital Stay (HOSPITAL_COMMUNITY): Payer: Self-pay | Admitting: Anesthesiology

## 2017-11-19 ENCOUNTER — Encounter (HOSPITAL_COMMUNITY)
Admission: EM | Disposition: A | Discharge status: Home / Self Care | Payer: Self-pay | Source: Home / Self Care | Attending: Internal Medicine

## 2017-11-19 ENCOUNTER — Encounter (HOSPITAL_COMMUNITY): Payer: Self-pay

## 2017-11-19 HISTORY — PX: CHOLECYSTECTOMY: SHX55

## 2017-11-19 LAB — CBC
HEMATOCRIT: 45.6 % (ref 39.0–52.0)
Hemoglobin: 15.3 g/dL (ref 13.0–17.0)
MCH: 30.7 pg (ref 26.0–34.0)
MCHC: 33.6 g/dL (ref 30.0–36.0)
MCV: 91.4 fL (ref 78.0–100.0)
Platelets: 115 10*3/uL — ABNORMAL LOW (ref 150–400)
RBC: 4.99 MIL/uL (ref 4.22–5.81)
RDW: 14.3 % (ref 11.5–15.5)
WBC: 21.9 10*3/uL — ABNORMAL HIGH (ref 4.0–10.5)

## 2017-11-19 LAB — COMPREHENSIVE METABOLIC PANEL
ALBUMIN: 3.2 g/dL — AB (ref 3.5–5.0)
ALT: 32 U/L (ref 0–44)
AST: 25 U/L (ref 15–41)
Alkaline Phosphatase: 77 U/L (ref 38–126)
Anion gap: 8 (ref 5–15)
BUN: 17 mg/dL (ref 6–20)
CHLORIDE: 104 mmol/L (ref 98–111)
CO2: 21 mmol/L — AB (ref 22–32)
CREATININE: 0.94 mg/dL (ref 0.61–1.24)
Calcium: 8.4 mg/dL — ABNORMAL LOW (ref 8.9–10.3)
GFR calc Af Amer: >60 mL/min (ref >60)
GFR calc non Af Amer: >60 mL/min (ref >60)
Glucose, Bld: 125 mg/dL — ABNORMAL HIGH (ref 70–99)
POTASSIUM: 4.1 mmol/L (ref 3.5–5.1)
SODIUM: 133 mmol/L — AB (ref 135–145)
Total Bilirubin: 1.8 mg/dL — ABNORMAL HIGH (ref 0.3–1.2)
Total Protein: 6.8 g/dL (ref 6.5–8.1)

## 2017-11-19 LAB — GLUCOSE, CAPILLARY
Glucose-Capillary: 129 mg/dL — ABNORMAL HIGH (ref 70–99)
Glucose-Capillary: 136 mg/dL — ABNORMAL HIGH (ref 70–99)
Glucose-Capillary: 133 mg/dL — ABNORMAL HIGH (ref 70–99)

## 2017-11-19 LAB — SURGICAL PCR SCREEN
MRSA, PCR: NEGATIVE
Staphylococcus aureus: NEGATIVE

## 2017-11-19 SURGERY — LAPAROSCOPIC CHOLECYSTECTOMY
Anesthesia: General | Site: Abdomen

## 2017-11-19 MED ORDER — HYDROCODONE-ACETAMINOPHEN 7.5-325 MG PO TABS: 1.0000 | ORAL_TABLET | Freq: Once | ORAL | Status: DC | PRN

## 2017-11-19 MED ORDER — CHLORHEXIDINE GLUCONATE CLOTH 2 % EX PADS: 6.0000 | MEDICATED_PAD | Freq: Once | CUTANEOUS | Status: DC

## 2017-11-19 MED ORDER — POVIDONE-IODINE 10 % OINT PACKET
TOPICAL_OINTMENT | CUTANEOUS | Status: DC | PRN
  Administered 2017-11-19: 1 via TOPICAL

## 2017-11-19 MED ORDER — PROPOFOL 10 MG/ML IV BOLUS
INTRAVENOUS | Status: DC | PRN
  Administered 2017-11-19: 200 mg via INTRAVENOUS

## 2017-11-19 MED ORDER — LORAZEPAM 2 MG/ML IJ SOLN: 1.0000 mg | INTRAMUSCULAR | Status: DC | PRN

## 2017-11-19 MED ORDER — ONDANSETRON HCL 4 MG/2ML IJ SOLN
INTRAMUSCULAR | Status: DC | PRN
  Administered 2017-11-19: 4 mg via INTRAVENOUS

## 2017-11-19 MED ORDER — SUCCINYLCHOLINE CHLORIDE 200 MG/10ML IV SOSY
PREFILLED_SYRINGE | INTRAVENOUS | Status: DC | PRN
  Administered 2017-11-19: 140 mg via INTRAVENOUS

## 2017-11-19 MED ORDER — FENTANYL CITRATE (PF) 250 MCG/5ML IJ SOLN
INTRAMUSCULAR | Status: AC
  Filled 2017-11-19: qty 5

## 2017-11-19 MED ORDER — ONDANSETRON 4 MG PO TBDP: 4.0000 mg | ORAL_TABLET | Freq: Four times a day (QID) | ORAL | Status: DC | PRN

## 2017-11-19 MED ORDER — MEPERIDINE HCL 50 MG/ML IJ SOLN: 6.2500 mg | INTRAMUSCULAR | Status: DC | PRN

## 2017-11-19 MED ORDER — SUCCINYLCHOLINE CHLORIDE 20 MG/ML IJ SOLN
INTRAMUSCULAR | Status: AC
  Filled 2017-11-19: qty 1

## 2017-11-19 MED ORDER — SUGAMMADEX SODIUM 200 MG/2ML IV SOLN
INTRAVENOUS | Status: DC | PRN
  Administered 2017-11-19: 196 mg via INTRAVENOUS

## 2017-11-19 MED ORDER — LACTATED RINGERS IV SOLN: INTRAVENOUS | Status: DC

## 2017-11-19 MED ORDER — SIMETHICONE 80 MG PO CHEW
40.0000 mg | CHEWABLE_TABLET | Freq: Four times a day (QID) | ORAL | Status: DC | PRN
Start: 2017-11-19 — End: 2017-11-20

## 2017-11-19 MED ORDER — HYDROMORPHONE HCL 1 MG/ML IJ SOLN
INTRAMUSCULAR | Status: AC
Start: 2017-11-19 — End: ?
  Filled 2017-11-19: qty 0.5

## 2017-11-19 MED ORDER — ONDANSETRON HCL 4 MG/2ML IJ SOLN
4.0000 mg | Freq: Four times a day (QID) | INTRAMUSCULAR | Status: DC | PRN
  Administered 2017-11-19: 4 mg via INTRAVENOUS
  Filled 2017-11-19: qty 2

## 2017-11-19 MED ORDER — KETOROLAC TROMETHAMINE 30 MG/ML IJ SOLN: 30.0000 mg | Freq: Four times a day (QID) | INTRAMUSCULAR | Status: DC | PRN

## 2017-11-19 MED ORDER — LACTATED RINGERS IV SOLN
INTRAVENOUS | Status: DC
  Administered 2017-11-19 – 2017-11-20 (×2): via INTRAVENOUS

## 2017-11-19 MED ORDER — ACETAMINOPHEN 650 MG RE SUPP: 650.0000 mg | Freq: Four times a day (QID) | RECTAL | Status: DC | PRN

## 2017-11-19 MED ORDER — SODIUM CHLORIDE 0.9 % IR SOLN
Status: DC | PRN
  Administered 2017-11-19: 3000 mL
  Administered 2017-11-19: 1000 mL

## 2017-11-19 MED ORDER — HYDROMORPHONE HCL 1 MG/ML IJ SOLN
1.0000 mg | INTRAMUSCULAR | Status: DC | PRN
  Administered 2017-11-19 – 2017-11-20 (×2): 1 mg via INTRAVENOUS
  Filled 2017-11-19 (×2): qty 1

## 2017-11-19 MED ORDER — BUPIVACAINE HCL (PF) 0.5 % IJ SOLN
INTRAMUSCULAR | Status: AC
  Filled 2017-11-19: qty 30

## 2017-11-19 MED ORDER — SUGAMMADEX SODIUM 200 MG/2ML IV SOLN
INTRAVENOUS | Status: AC
Start: 2017-11-19 — End: ?
  Filled 2017-11-19: qty 2

## 2017-11-19 MED ORDER — HEMOSTATIC AGENTS (NO CHARGE) OPTIME
TOPICAL | Status: DC | PRN
  Administered 2017-11-19: 1 via TOPICAL

## 2017-11-19 MED ORDER — HYDROMORPHONE HCL 1 MG/ML IJ SOLN
0.2500 mg | INTRAMUSCULAR | Status: DC | PRN
Start: 2017-11-19 — End: 2017-11-19
  Administered 2017-11-19: 0.5 mg via INTRAVENOUS
  Filled 2017-11-19: qty 0.5

## 2017-11-19 MED ORDER — OXYCODONE-ACETAMINOPHEN 5-325 MG PO TABS: 1.0000 | ORAL_TABLET | ORAL | Status: DC | PRN

## 2017-11-19 MED ORDER — BUPIVACAINE HCL (PF) 0.5 % IJ SOLN
INTRAMUSCULAR | Status: DC | PRN
  Administered 2017-11-19: 10 mL

## 2017-11-19 MED ORDER — MIDAZOLAM HCL 2 MG/2ML IJ SOLN
INTRAMUSCULAR | Status: AC
  Filled 2017-11-19: qty 2

## 2017-11-19 MED ORDER — MIDAZOLAM HCL 5 MG/5ML IJ SOLN
INTRAMUSCULAR | Status: DC | PRN
  Administered 2017-11-19 (×2): 1 mg via INTRAVENOUS

## 2017-11-19 MED ORDER — POVIDONE-IODINE 10 % EX OINT
TOPICAL_OINTMENT | CUTANEOUS | Status: AC
  Filled 2017-11-19: qty 1

## 2017-11-19 MED ORDER — SODIUM CHLORIDE 0.9% FLUSH
INTRAVENOUS | Status: AC
  Filled 2017-11-19: qty 10

## 2017-11-19 MED ORDER — ENOXAPARIN SODIUM 40 MG/0.4ML ~~LOC~~ SOLN: 40.0000 mg | SUBCUTANEOUS | Status: DC

## 2017-11-19 MED ORDER — FENTANYL CITRATE (PF) 100 MCG/2ML IJ SOLN
INTRAMUSCULAR | Status: DC | PRN
  Administered 2017-11-19 (×4): 50 ug via INTRAVENOUS

## 2017-11-19 MED ORDER — ROCURONIUM BROMIDE 100 MG/10ML IV SOLN
INTRAVENOUS | Status: DC | PRN
  Administered 2017-11-19: 30 mg via INTRAVENOUS
  Administered 2017-11-19: 10 mg via INTRAVENOUS

## 2017-11-19 MED ORDER — HYDROMORPHONE HCL 1 MG/ML IJ SOLN
0.5000 mg | INTRAMUSCULAR | Status: AC | PRN
Start: 2017-11-19 — End: 2017-11-19
  Administered 2017-11-19 (×2): 0.5 mg via INTRAVENOUS
  Filled 2017-11-19: qty 0.5

## 2017-11-19 MED ORDER — ACETAMINOPHEN 325 MG PO TABS: 650.0000 mg | ORAL_TABLET | Freq: Four times a day (QID) | ORAL | Status: DC | PRN

## 2017-11-19 MED ORDER — LACTATED RINGERS IV SOLN
INTRAVENOUS | Status: DC
  Administered 2017-11-19 (×2): via INTRAVENOUS

## 2017-11-19 MED ORDER — KETOROLAC TROMETHAMINE 30 MG/ML IJ SOLN
INTRAMUSCULAR | Status: AC
Start: 2017-11-19 — End: ?
  Filled 2017-11-19: qty 1

## 2017-11-19 MED ORDER — PROMETHAZINE HCL 25 MG/ML IJ SOLN
6.2500 mg | INTRAMUSCULAR | Status: DC | PRN
  Administered 2017-11-19: 6.25 mg via INTRAVENOUS
  Filled 2017-11-19: qty 1

## 2017-11-19 MED ORDER — KETOROLAC TROMETHAMINE 30 MG/ML IJ SOLN
30.0000 mg | Freq: Four times a day (QID) | INTRAMUSCULAR | Status: AC
  Administered 2017-11-19: 30 mg via INTRAVENOUS

## 2017-11-19 SURGICAL SUPPLY — 55 items
APPLICATOR ARISTA FLEXITIP XL (MISCELLANEOUS) ×2 IMPLANT
APPLIER CLIP ROT 10 11.4 M/L (STAPLE) ×3
BAG RETRIEVAL 10 (BASKET) ×1
BAG RETRIEVAL 10MM (BASKET) ×1
CHLORAPREP W/TINT 26ML (MISCELLANEOUS) ×3 IMPLANT
CLIP APPLIE ROT 10 11.4 M/L (STAPLE) ×1 IMPLANT
CLOTH BEACON ORANGE TIMEOUT ST (SAFETY) ×3 IMPLANT
COVER LIGHT HANDLE STERIS (MISCELLANEOUS) ×6 IMPLANT
CUTTER FLEX LINEAR 45M (STAPLE) ×2 IMPLANT
DECANTER SPIKE VIAL GLASS SM (MISCELLANEOUS) ×3 IMPLANT
ELECT REM PT RETURN 9FT ADLT (ELECTROSURGICAL) ×3
ELECTRODE REM PT RTRN 9FT ADLT (ELECTROSURGICAL) ×1 IMPLANT
FILTER SMOKE EVAC LAPAROSHD (FILTER) ×3 IMPLANT
GLOVE BIO SURGEON STRL SZ 6.5 (GLOVE) ×1 IMPLANT
GLOVE BIO SURGEON STRL SZ7 (GLOVE) ×2 IMPLANT
GLOVE BIO SURGEONS STRL SZ 6.5 (GLOVE) ×1
GLOVE BIOGEL M 6.5 STRL (GLOVE) ×2 IMPLANT
GLOVE BIOGEL PI IND STRL 6.5 (GLOVE) IMPLANT
GLOVE BIOGEL PI IND STRL 7.0 (GLOVE) ×1 IMPLANT
GLOVE BIOGEL PI INDICATOR 6.5 (GLOVE) ×2
GLOVE BIOGEL PI INDICATOR 7.0 (GLOVE) ×6
GLOVE SURG SS PI 7.5 STRL IVOR (GLOVE) ×3 IMPLANT
GOWN STRL REUS W/ TWL XL LVL3 (GOWN DISPOSABLE) ×1 IMPLANT
GOWN STRL REUS W/TWL LRG LVL3 (GOWN DISPOSABLE) ×6 IMPLANT
GOWN STRL REUS W/TWL XL LVL3 (GOWN DISPOSABLE) ×2
HEMOSTAT ARISTA ABSORB 3G PWDR (MISCELLANEOUS) ×2 IMPLANT
HEMOSTAT SNOW SURGICEL 2X4 (HEMOSTASIS) ×3 IMPLANT
INST SET LAPROSCOPIC AP (KITS) ×3 IMPLANT
IV NS IRRIG 3000ML ARTHROMATIC (IV SOLUTION) ×2 IMPLANT
KIT TURNOVER KIT A (KITS) ×3 IMPLANT
MANIFOLD NEPTUNE II (INSTRUMENTS) ×3 IMPLANT
NDL INSUFFLATION 14GA 120MM (NEEDLE) ×1 IMPLANT
NEEDLE INSUFFLATION 14GA 120MM (NEEDLE) ×3 IMPLANT
NS IRRIG 1000ML POUR BTL (IV SOLUTION) ×3 IMPLANT
PACK LAP CHOLE LZT030E (CUSTOM PROCEDURE TRAY) ×3 IMPLANT
PAD ARMBOARD 7.5X6 YLW CONV (MISCELLANEOUS) ×3 IMPLANT
RELOAD 45 VASCULAR/THIN (ENDOMECHANICALS) ×3 IMPLANT
RELOAD STAPLE 45 2.5 WHT GRN (ENDOMECHANICALS) IMPLANT
SET BASIN LINEN APH (SET/KITS/TRAYS/PACK) ×3 IMPLANT
SET TUBE IRRIG SUCTION NO TIP (IRRIGATION / IRRIGATOR) ×2 IMPLANT
SLEEVE ENDOPATH XCEL 5M (ENDOMECHANICALS) ×3 IMPLANT
SPONGE GAUZE 2X2 8PLY STER LF (GAUZE/BANDAGES/DRESSINGS) ×1
SPONGE GAUZE 2X2 8PLY STRL LF (GAUZE/BANDAGES/DRESSINGS) ×5 IMPLANT
STAPLER VISISTAT (STAPLE) ×3 IMPLANT
SUT VICRYL 0 UR6 27IN ABS (SUTURE) ×5 IMPLANT
SYS BAG RETRIEVAL 10MM (BASKET) ×1
SYSTEM BAG RETRIEVAL 10MM (BASKET) ×1 IMPLANT
TAPE PAPER 2X10 WHT MICROPORE (GAUZE/BANDAGES/DRESSINGS) ×2 IMPLANT
TROCAR ENDO BLADELESS 11MM (ENDOMECHANICALS) ×3 IMPLANT
TROCAR XCEL NON-BLD 5MMX100MML (ENDOMECHANICALS) ×3 IMPLANT
TROCAR XCEL UNIV SLVE 11M 100M (ENDOMECHANICALS) ×3 IMPLANT
TUBE CONNECTING 12'X1/4 (SUCTIONS) ×1
TUBE CONNECTING 12X1/4 (SUCTIONS) ×2 IMPLANT
TUBING INSUFFLATION (TUBING) ×3 IMPLANT
WARMER LAPAROSCOPE (MISCELLANEOUS) ×3 IMPLANT

## 2017-11-19 NOTE — Progress Notes (Signed)
Instructed pt on IS. Pt gave poor effort. Pt states that he is in too much pain and will do it later because he already knows how to do it. Pt states he doesn't need pain meds at this time

## 2017-11-19 NOTE — Transfer of Care (Signed)
Immediate Anesthesia Transfer of Care Note  Patient: Blake Juarez  Procedure(s) Performed: LAPAROSCOPIC CHOLECYSTECTOMY (N/A Abdomen)  Patient Location: PACU  Anesthesia Type:General  Level of Consciousness: awake, oriented and patient cooperative  Airway & Oxygen Therapy: Patient Spontanous Breathing and Patient connected to face mask oxygen  Post-op Assessment: Report given to RN and Post -op Vital signs reviewed and stable  Post vital signs: Reviewed and stable  Last Vitals:  Vitals Value Taken Time  BP 143/103 11/19/2017  3:47 PM  Temp    Pulse 106 11/19/2017  3:50 PM  Resp 21 11/19/2017  3:50 PM  SpO2 100 % 11/19/2017  3:50 PM  Vitals shown include unvalidated device data.  Last Pain:  Vitals:   11/19/17 1545  TempSrc:   PainSc: (P) Asleep      Patients Stated Pain Goal: 6 (11/19/17 1200)  Complications: No apparent anesthesia complications

## 2017-11-19 NOTE — Anesthesia Preprocedure Evaluation (Signed)
Anesthesia Evaluation  Patient identified by MRN, date of birth, ID band Patient awake    Reviewed: Allergy & Precautions, H&P , NPO status , Patient's Chart, lab work & pertinent test results, reviewed documented beta blocker date and time   Airway Mallampati: II  TM Distance: >3 FB Neck ROM: full    Dental no notable dental hx. (+) Edentulous Upper, Dental Advidsory Given   Pulmonary neg pulmonary ROS, Current Smoker,    Pulmonary exam normal breath sounds clear to auscultation       Cardiovascular Exercise Tolerance: Good hypertension, On Medications negative cardio ROS   Rhythm:regular Rate:Normal     Neuro/Psych negative neurological ROS  negative psych ROS   GI/Hepatic negative GI ROS, Neg liver ROS,   Endo/Other  negative endocrine ROS  Renal/GU negative Renal ROS  negative genitourinary   Musculoskeletal   Abdominal   Peds  Hematology negative hematology ROS (+)   Anesthesia Other Findings Recent b/l hernia repair without anesthesia issues Pain in holding- tx with dilaudid Tobacco abuse Denies CNS, pulm, endo, CP/MI hx  Reproductive/Obstetrics negative OB ROS                             Anesthesia Physical Anesthesia Plan  ASA: III  Anesthesia Plan: General ETT   Post-op Pain Management:    Induction:   PONV Risk Score and Plan:   Airway Management Planned:   Additional Equipment:   Intra-op Plan:   Post-operative Plan:   Informed Consent: I have reviewed the patients History and Physical, chart, labs and discussed the procedure including the risks, benefits and alternatives for the proposed anesthesia with the patient or authorized representative who has indicated his/her understanding and acceptance.   Dental Advisory Given  Plan Discussed with: CRNA and Anesthesiologist  Anesthesia Plan Comments:         Anesthesia Quick Evaluation

## 2017-11-19 NOTE — Anesthesia Procedure Notes (Signed)
Procedure Name: Intubation Date/Time: 11/19/2017 2:26 PM Performed by: Pernell DupreAdams, Caleigh Rabelo A, CRNA Pre-anesthesia Checklist: Timeout performed, Patient identified, Emergency Drugs available, Suction available and Patient being monitored Patient Re-evaluated:Patient Re-evaluated prior to induction Oxygen Delivery Method: Circle System Utilized Preoxygenation: Pre-oxygenation with 100% oxygen Induction Type: IV induction Ventilation: Mask ventilation without difficulty Laryngoscope Size: Miller and 3 Grade View: Grade I Tube type: Oral Tube size: 7.0 mm Number of attempts: 1 Airway Equipment and Method: Stylet Placement Confirmation: ETT inserted through vocal cords under direct vision,  positive ETCO2 and breath sounds checked- equal and bilateral Secured at: 22 cm Tube secured with: Tape Dental Injury: Teeth and Oropharynx as per pre-operative assessment

## 2017-11-19 NOTE — Op Note (Signed)
Patient:  Blake Juarez  DOB:  1960-07-27  MRN:  [id number]   Preop Diagnosis: Acute cholecystitis, cholelithiasis  Postop Diagnosis: Same  Procedure: Laparoscopic cholecystectomy  Surgeon: Franky MachoMark Burney Calzadilla, MD  Assistant: Larae GroomsLindsey Bridges, MD  Anes: General tracheal  Indications: Patient is a 57 year old white male who presents with acute cholecystitis secondary to cholelithiasis.  The risks and benefits of the procedure including bleeding, infection, hepatobiliary injury, and the possibility of an open procedure were fully explained to the patient, who gave informed consent.  Procedure note: The patient was placed in supine position.  After induction of general endotracheal anesthesia, the abdomen was prepped and draped using the usual sterile technique with DuraPrep.  Surgical site confirmation was performed.  A supraumbilical incision was made down to the fascia.  A Veress needle was introduced into the abdominal cavity and confirmation of placement was done using saline drop test.  The abdomen was then insufflated to 16 mmHg pressure.  An 11 mm trocar was introduced into the abdominal cavity under direct visualization without difficulty.  The patient was placed in reverse Trendelenburg position and an additional 11 mm trocar was placed in the epigastric region and 5 mm trochars were placed the right upper quadrant and right flank regions.  The liver was inspected and noted within normal limits.  The gallbladder was severely inflamed and distended with hyperemic gallbladder wall and surrounding omentum encasing it.  The omentum was freed away bluntly.  The gallbladder had to be decompressed at its fundus in order to facilitate dynamic exposure of the triangle of Calot.  The dissection was begun around the infundibulum.  Ultimately, we were able to identify the cystic duct and its juncture towards the common bile duct.  The junction to the infundibulum was fully identified.  A vascular Endo GIA was  placed across the cystic duct and fired.  The cystic artery and associated vessels were ligated divided using clips.  The gallbladder was then freed away from the gallbladder fossa using Bovie electrocautery and blunt dissection.  The gallbladder was delivered through the epigastric trocar site using an Endo Catch bag.  The gallbladder fossa was inspected and any bleeding was controlled using Bovie electrocautery.  The cystic duct stump was inspected and no bile leakage was noted.  Arista and Surgicel were placed in the gallbladder fossa.  All fluid and air were then evacuated from the abdominal cavity prior to the removal of the trochars.  All wounds were irrigated with normal saline.  All wounds were injected with 0.5% Sensorcaine.  The supraumbilical fascia as well as epigastric fascia were reapproximated using 0 Vicryl interrupted sutures.  All skin incisions were closed using staples.  Betadine ointment and dry sterile dressings were applied.  All tape and needle counts were correct at the end of the procedure.  Patient was extubated in the operating room and transferred to PACU in stable condition.  Dr. Henreitta LeberBridges was assisting throughout the entirety of the case.  Complications: None  EBL: Less than 50 cc  Specimen: Gallbladder

## 2017-11-19 NOTE — Progress Notes (Signed)
Patient back to room from PACU via bed. Patient is lethargic but does respond to stimuli. Patient has 4 incision on abdomen with gauze dressings, scant drainage on dressings. Family is at bedside. Will continue to monitor.

## 2017-11-19 NOTE — Interval H&P Note (Signed)
History and Physical Interval Note:  11/19/2017 2:10 PM  Blake Juarez  has presented today for surgery, with the diagnosis of acute cholecystitis  The various methods of treatment have been discussed with the patient and family. After consideration of risks, benefits and other options for treatment, the patient has consented to  Procedure(s): LAPAROSCOPIC CHOLECYSTECTOMY (N/A) as a surgical intervention .  The patient's history has been reviewed, patient examined, no change in status, stable for surgery.  I have reviewed the patient's chart and labs.  Questions were answered to the patient's satisfaction.     Blake Juarez

## 2017-11-19 NOTE — Anesthesia Postprocedure Evaluation (Signed)
Anesthesia Post Note Late Entry for 1615  Patient: Blake Juarez  Procedure(s) Performed: LAPAROSCOPIC CHOLECYSTECTOMY (N/A Abdomen)  Patient location during evaluation: PACU Anesthesia Type: General Level of consciousness: awake and alert, oriented and patient cooperative Pain management: pain level controlled Vital Signs Assessment: post-procedure vital signs reviewed and stable Respiratory status: spontaneous breathing Postop Assessment: no apparent nausea or vomiting Anesthetic complications: no     Last Vitals:  Vitals:   11/19/17 1645 11/19/17 1652  BP:  123/84  Pulse: (!) 103 (!) 104  Resp: 16 18  Temp:    SpO2: 96% 100%    Last Pain:  Vitals:   11/19/17 1640  TempSrc:   PainSc: 2                  ADAMS, AMY A

## 2017-11-20 LAB — COMPREHENSIVE METABOLIC PANEL
ALT: 37 U/L (ref 0–44)
AST: 33 U/L (ref 15–41)
Albumin: 2.4 g/dL — ABNORMAL LOW (ref 3.5–5.0)
Alkaline Phosphatase: 100 U/L (ref 38–126)
Anion gap: 6 (ref 5–15)
BILIRUBIN TOTAL: 1.6 mg/dL — AB (ref 0.3–1.2)
BUN: 23 mg/dL — AB (ref 6–20)
CALCIUM: 7.7 mg/dL — AB (ref 8.9–10.3)
CHLORIDE: 102 mmol/L (ref 98–111)
CO2: 26 mmol/L (ref 22–32)
CREATININE: 1.12 mg/dL (ref 0.61–1.24)
Glucose, Bld: 119 mg/dL — ABNORMAL HIGH (ref 70–99)
Potassium: 4.2 mmol/L (ref 3.5–5.1)
Sodium: 134 mmol/L — ABNORMAL LOW (ref 135–145)
TOTAL PROTEIN: 5.7 g/dL — AB (ref 6.5–8.1)

## 2017-11-20 LAB — CBC
HCT: 37.7 % — ABNORMAL LOW (ref 39.0–52.0)
HEMOGLOBIN: 12.4 g/dL — AB (ref 13.0–17.0)
MCH: 30.3 pg (ref 26.0–34.0)
MCHC: 32.9 g/dL (ref 30.0–36.0)
MCV: 92.2 fL (ref 78.0–100.0)
PLATELETS: 119 10*3/uL — AB (ref 150–400)
RBC: 4.09 MIL/uL — ABNORMAL LOW (ref 4.22–5.81)
RDW: 14.4 % (ref 11.5–15.5)
WBC: 12.5 10*3/uL — ABNORMAL HIGH (ref 4.0–10.5)

## 2017-11-20 LAB — MAGNESIUM: MAGNESIUM: 2 mg/dL (ref 1.7–2.4)

## 2017-11-20 LAB — PHOSPHORUS: PHOSPHORUS: 3.3 mg/dL (ref 2.5–4.6)

## 2017-11-20 MED ORDER — OXYCODONE-ACETAMINOPHEN 7.5-325 MG PO TABS: 1.0000 | ORAL_TABLET | Freq: Four times a day (QID) | ORAL | 0 refills | Status: AC | PRN

## 2017-11-20 MED ORDER — CIPROFLOXACIN HCL 500 MG PO TABS: 500.0000 mg | ORAL_TABLET | Freq: Two times a day (BID) | ORAL | 0 refills | Status: AC

## 2017-11-20 NOTE — Discharge Instructions (Signed)
Laparoscopic Cholecystectomy, Care After °This sheet gives you information about how to care for yourself after your procedure. Your health care provider may also give you more specific instructions. If you have problems or questions, contact your health care provider. °What can I expect after the procedure? °After the procedure, it is common to have: °· Pain at your incision sites. You will be given medicines to control this pain. °· Mild nausea or vomiting. °· Bloating and possible shoulder pain from the air-like gas that was used during the procedure. ° °Follow these instructions at home: °Incision care ° °· Follow instructions from your health care provider about how to take care of your incisions. Make sure you: °? Wash your hands with soap and water before you change your bandage (dressing). If soap and water are not available, use hand sanitizer. °? Change your dressing as told by your health care provider. °? Leave stitches (sutures), skin glue, or adhesive strips in place. These skin closures may need to be in place for 2 weeks or longer. If adhesive strip edges start to loosen and curl up, you may trim the loose edges. Do not remove adhesive strips completely unless your health care provider tells you to do that. °· Do not take baths, swim, or use a hot tub until your health care provider approves. Ask your health care provider if you can take showers. You may only be allowed to take sponge baths for bathing. °· Check your incision area every day for signs of infection. Check for: °? More redness, swelling, or pain. °? More fluid or blood. °? Warmth. °? Pus or a bad smell. °Activity °· Do not drive or use heavy machinery while taking prescription pain medicine. °· Do not lift anything that is heavier than 10 lb (4.5 kg) until your health care provider approves. °· Do not play contact sports until your health care provider approves. °· Do not drive for 24 hours if you were given a medicine to help you relax  (sedative). °· Rest as needed. Do not return to work or school until your health care provider approves. °General instructions °· Take over-the-counter and prescription medicines only as told by your health care provider. °· To prevent or treat constipation while you are taking prescription pain medicine, your health care provider may recommend that you: °? Drink enough fluid to keep your urine clear or pale yellow. °? Take over-the-counter or prescription medicines. °? Eat foods that are high in fiber, such as fresh fruits and vegetables, whole grains, and beans. °? Limit foods that are high in fat and processed sugars, such as fried and sweet foods. °Contact a health care provider if: °· You develop a rash. °· You have more redness, swelling, or pain around your incisions. °· You have more fluid or blood coming from your incisions. °· Your incisions feel warm to the touch. °· You have pus or a bad smell coming from your incisions. °· You have a fever. °· One or more of your incisions breaks open. °Get help right away if: °· You have trouble breathing. °· You have chest pain. °· You have increasing pain in your shoulders. °· You faint or feel dizzy when you stand. °· You have severe pain in your abdomen. °· You have nausea or vomiting that lasts for more than one day. °· You have leg pain. °This information is not intended to replace advice given to you by your health care provider. Make sure you discuss any questions you   have with your health care provider. °Document Released: 04/29/2005 Document Revised: 11/18/2015 Document Reviewed: 10/16/2015 °Elsevier Interactive Patient Education © 2018 [employer] ° °

## 2017-11-20 NOTE — Discharge Summary (Signed)
Physician Discharge Summary  Patient ID: Blake Juarez MRN: [id number] DOB/AGE: [birth year] 57 y.o.  Admit date: 11/17/2017 Discharge date: 11/20/2017  Admission Diagnoses: Acute cholecystitis, cholelithiasis  Discharge Diagnoses: Same Active Problems:   Acute cholecystitis   Hyperglycemia   Essential hypertension   Tobacco abuse   Preoperative cardiovascular examination   Discharged Condition: good  Hospital Course: Patient is a 57 year old white male who presented to the emergency room with worsening right upper quadrant abdominal pain and epigastric pain.  He was found on CT scan of the abdomen to have acute cholecystitis with cholelithiasis.  He did have a leukocytosis.  He also was noted to have an elevated troponin.  He was admitted to the hospital for further evaluation and treatment.  He was started on IV Zosyn.  Cardiology consultation was obtained.  Patient underwent echocardiogram which was unremarkable.  Patient subsequently underwent laparoscopic cholecystectomy on 11/19/2017.  Tolerated procedure well.  His postoperative course has been unremarkable.  His diet was advanced without difficulty.  He does state that he has a decreased appetite.  Patient is being discharged home on 11/20/2017 in good and improving condition.  Consults: cardiology   Treatments: surgery: Laparoscopic cholecystectomy on 11/19/2017  Discharge Exam: Blood pressure 112/77, pulse 92, temperature 97.8 F (36.6 C), temperature source Oral, resp. rate 18, height 6\' 1"  (1.854 m), weight 216 lb 0.8 oz (98 kg), SpO2 97 %. General appearance: alert, cooperative and no distress Resp: clear to auscultation bilaterally Cardio: regular rate and rhythm, S1, S2 normal, no murmur, click, rub or gallop GI: Soft, dressing is dry and intact.  Disposition: Discharge disposition: 01-Home or Self Care       Discharge Instructions    Diet - low sodium heart healthy   Complete by:  As directed    Increase activity  slowly   Complete by:  As directed      Allergies as of 11/20/2017   No Known Allergies     Medication List    TAKE these medications   ciprofloxacin 500 MG tablet Commonly known as:  CIPRO Take 1 tablet (500 mg total) by mouth 2 (two) times daily.   metoprolol tartrate 50 MG tablet Commonly known as:  LOPRESSOR Take 50 mg by mouth 2 (two) times daily.   oxyCODONE-acetaminophen 7.5-325 MG tablet Commonly known as:  PERCOCET Take 1-2 tablets by mouth every 6 (six) hours as needed.      Follow-up Information    Blake Juarez, Harlie, MD. Schedule an appointment as soon as possible for a visit on 11/27/2017.   Specialty:  General Surgery Contact information: [address] [place] [id number] [phone]           Signed: Franky MachoMark Axl Juarez 11/20/2017, 7:49 AM

## 2017-11-21 ENCOUNTER — Encounter (HOSPITAL_COMMUNITY): Payer: Self-pay | Admitting: General Surgery

## 2017-11-27 ENCOUNTER — Encounter: Payer: Self-pay | Admitting: General Surgery

## 2017-11-27 ENCOUNTER — Ambulatory Visit (INDEPENDENT_AMBULATORY_CARE_PROVIDER_SITE_OTHER): Payer: Self-pay | Admitting: General Surgery

## 2017-11-27 VITALS — BP 118/81 | HR 90 | Temp 97.8°F | Resp 24 | Wt 202.0 lb

## 2017-11-27 DIAGNOSIS — Z09 Encounter for follow-up examination after completed treatment for conditions other than malignant neoplasm: Secondary | ICD-10-CM

## 2017-11-27 NOTE — Progress Notes (Signed)
Subjective:     Blake Juarez  Status post laparoscopic cholecystectomy.  Patient doing well.  Has mild incisional pain. Objective:    BP 118/81 (BP Location: Left Arm, Patient Position: Sitting, Cuff Size: Large)   Pulse 90   Temp 97.8 F (36.6 C) (Temporal)   Resp (!) 24   Wt 202 lb (91.6 kg)   BMI 26.65 kg/m   General:  alert, cooperative and no distress  Abdomen soft, incisions healing well.  Staples removed, Steri-Strips applied. Final pathology consistent with diagnosis.     Assessment:    Doing well postoperatively.    Plan:   May return to work without restrictions on 12/15/2017.  Follow-up here as needed.

## 2019-01-19 ENCOUNTER — Other Ambulatory Visit: Payer: Self-pay

## 2019-01-19 DIAGNOSIS — Z20822 Contact with and (suspected) exposure to covid-19: Secondary | ICD-10-CM

## 2019-01-20 LAB — NOVEL CORONAVIRUS, NAA: SARS-CoV-2, NAA: NOT DETECTED

## 2019-08-13 ENCOUNTER — Ambulatory Visit: Payer: Self-pay | Attending: Internal Medicine

## 2019-08-13 DIAGNOSIS — Z23 Encounter for immunization: Secondary | ICD-10-CM

## 2019-08-13 NOTE — Progress Notes (Signed)
   Covid-19 Vaccination Clinic  Name:  Blake Juarez    MRN: [id number] DOB: 1961/01/20  08/13/2019  Mr. [name] was observed post Covid-19 immunization for 15 minutes without incident. He was provided with Vaccine Information Sheet and instruction to access the V-Safe system.   Mr. [name] was instructed to call 911 with any severe reactions post vaccine: Marland Kitchen Difficulty breathing  . Swelling of face and throat  . A fast heartbeat  . A bad rash all over body  . Dizziness and weakness   Immunizations Administered    Name Date Dose VIS Date Route   Moderna COVID-19 Vaccine 08/13/2019  7:59 AM 0.5 mL 04/13/2019 Intramuscular   Manufacturer: Moderna   Lot: [id number]   NDC: 53912-258-34

## 2019-09-14 ENCOUNTER — Ambulatory Visit: Payer: Self-pay | Attending: Internal Medicine

## 2019-09-14 DIAGNOSIS — Z23 Encounter for immunization: Secondary | ICD-10-CM

## 2019-09-14 NOTE — Progress Notes (Signed)
   Covid-19 Vaccination Clinic  Name:  Blake Juarez    MRN: [id number] DOB: 02/27/1961  09/14/2019  [name] was observed post Covid-19 immunization for 15 minutes without incident. He was provided with Vaccine Information Sheet and instruction to access the V-Safe system.   [name] was instructed to call 911 with any severe reactions post vaccine: Marland Kitchen Difficulty breathing  . Swelling of face and throat  . A fast heartbeat  . A bad rash all over body  . Dizziness and weakness   Immunizations Administered    Name Date Dose VIS Date Route   Moderna COVID-19 Vaccine 09/14/2019 12:18 PM 0.5 mL 04/2019 Intramuscular   Manufacturer: Moderna   Lot: [id number]   NDC: 94709-628-36

## 2019-09-27 IMAGING — US US ABDOMEN LIMITED
1 series · 14 of 25 positions shown · non-contrast
Comparison: CT scan of November 17, 2017.

CLINICAL DATA: Acute cholecystitis.

EXAM:
ULTRASOUND ABDOMEN LIMITED RIGHT UPPER QUADRANT

[Series 1: us abdomen limited · 0.21mm/px · 14 of 69 slices shown]
[im 1/69]
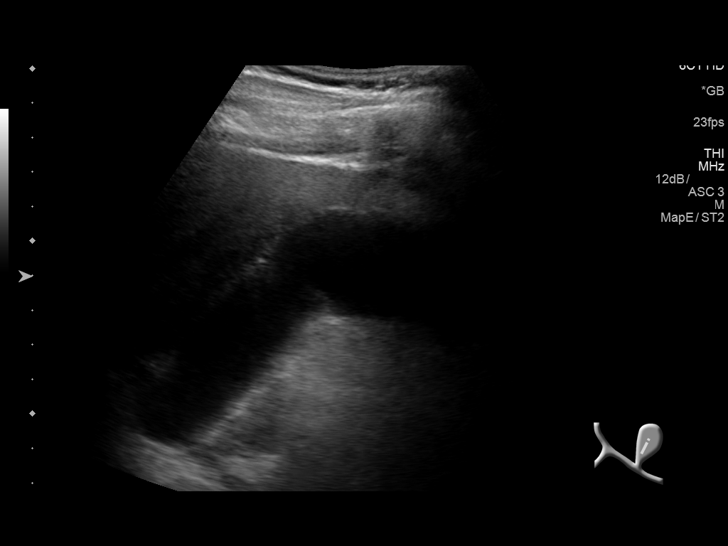
[im 6/69]
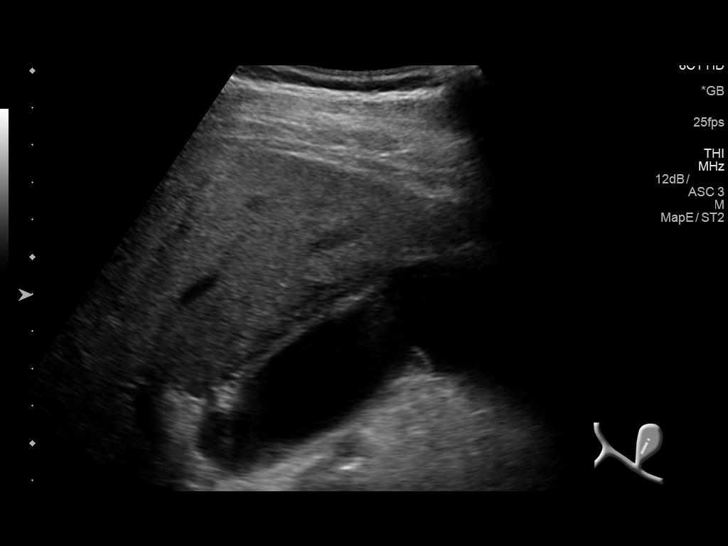
[im 12/69]
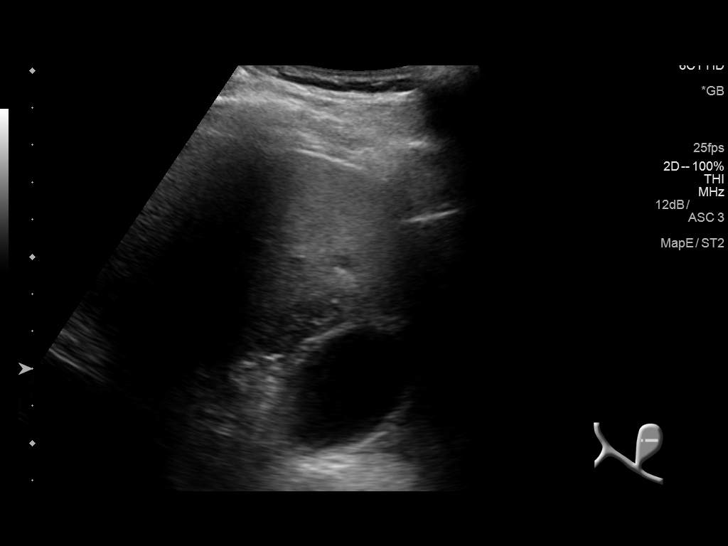
[im 18/69]
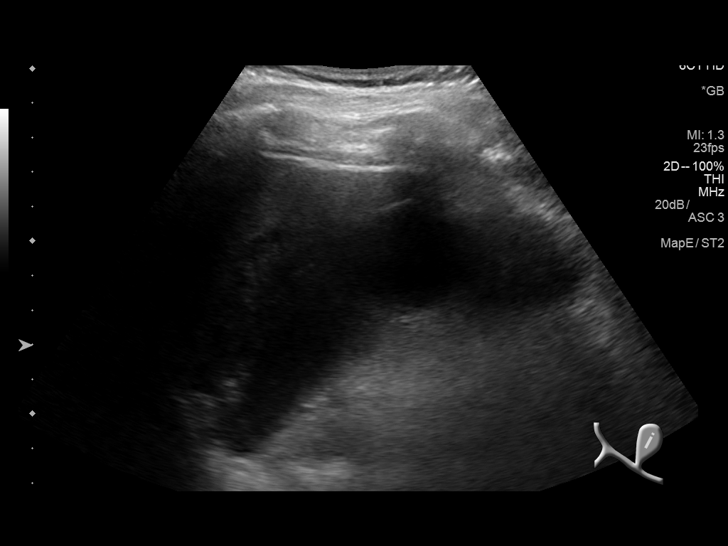
[im 23/69]
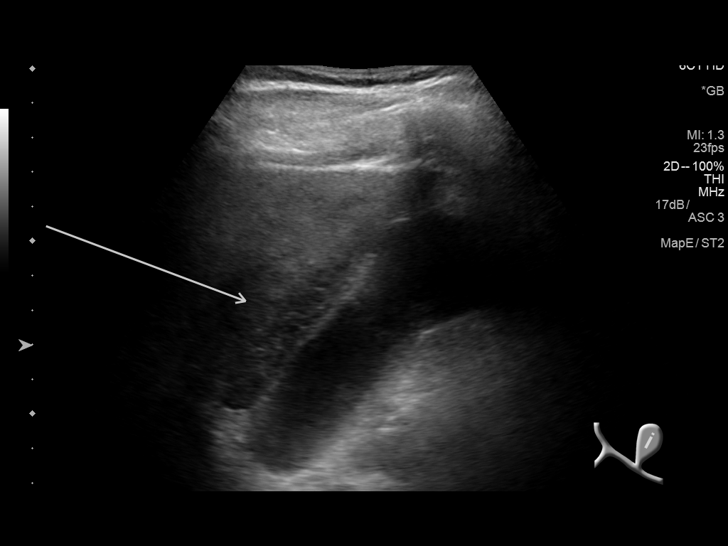
[im 26/69]
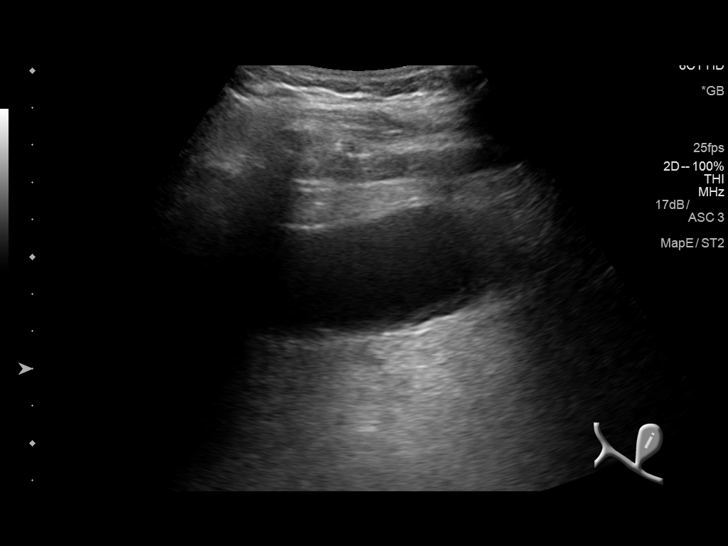
[im 32/69]
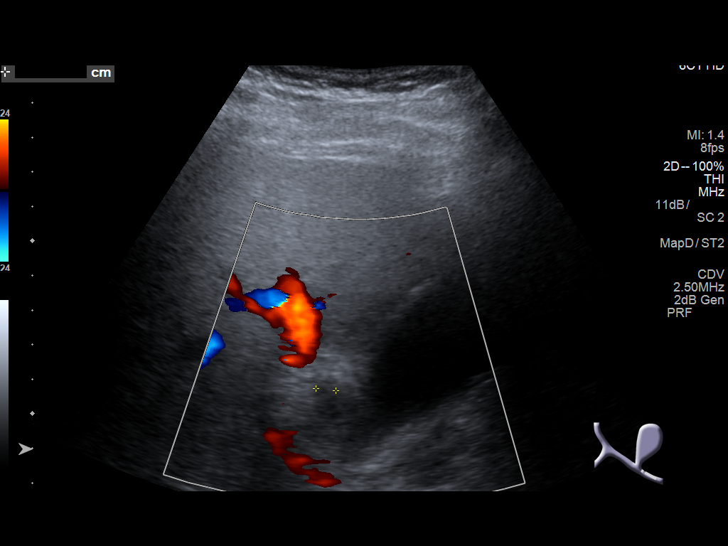
[im 37/69]
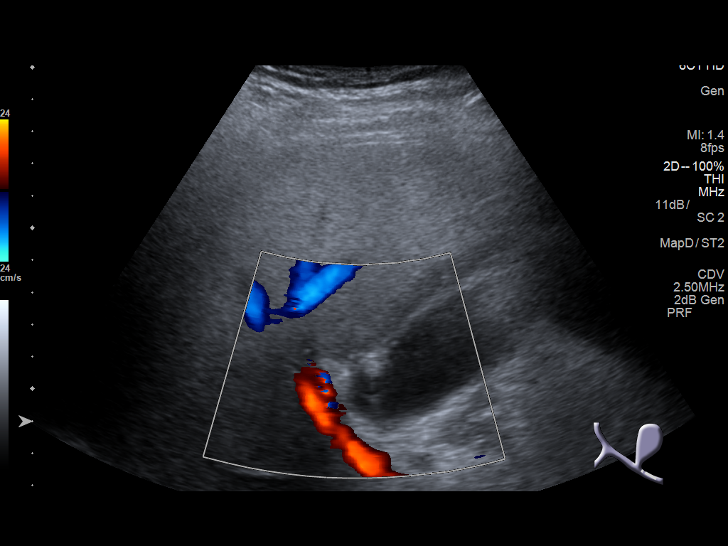
[im 43/69]
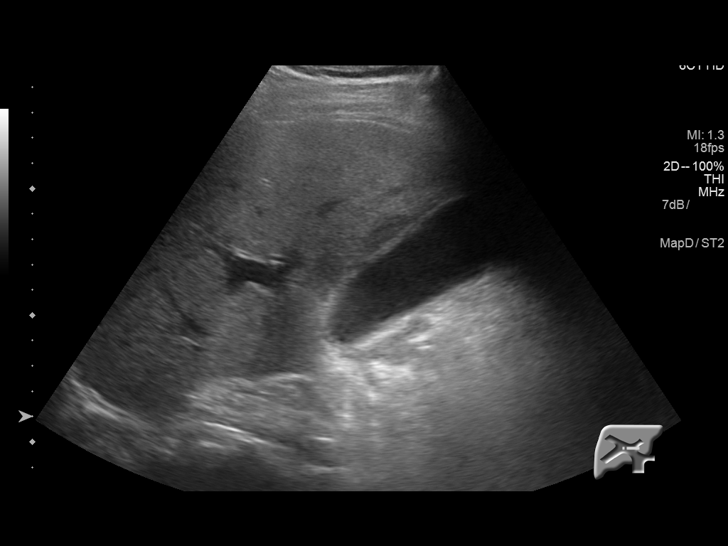
[im 46/69]
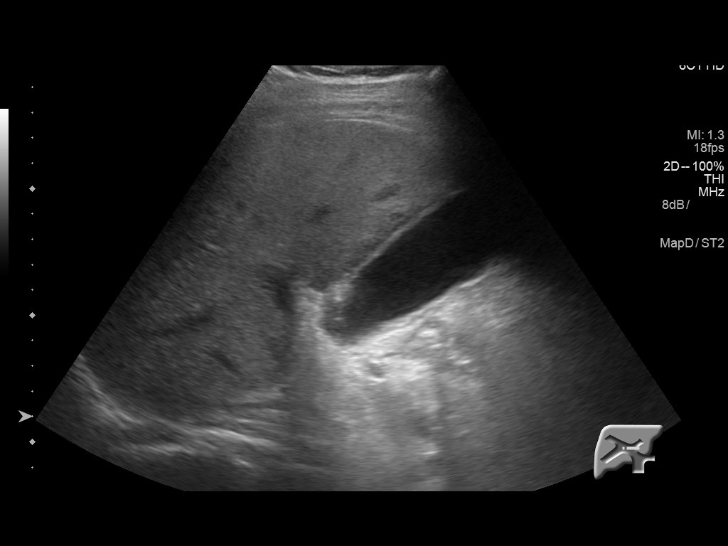
[im 52/69]
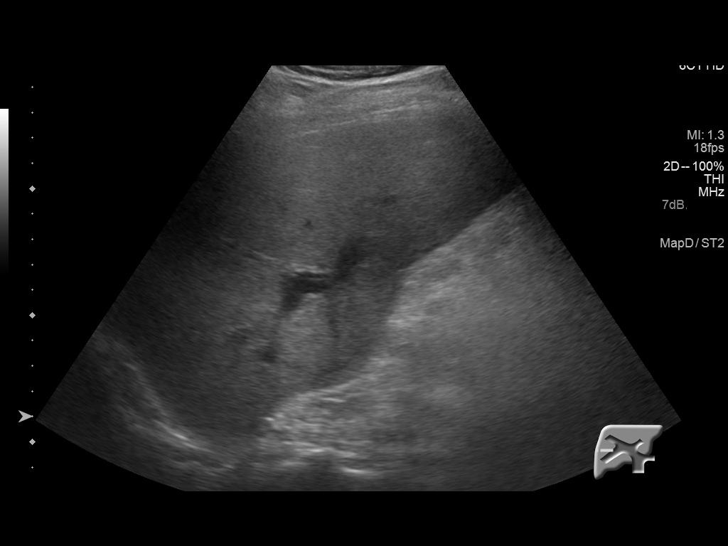
[im 57/69]
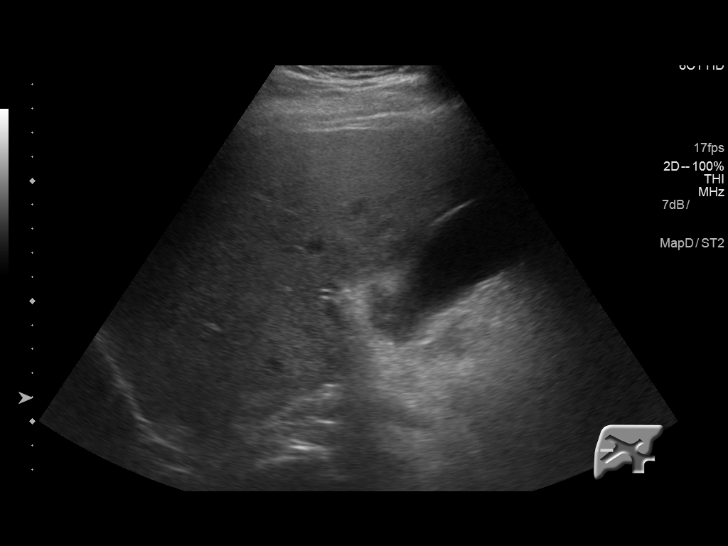
[im 63/69]
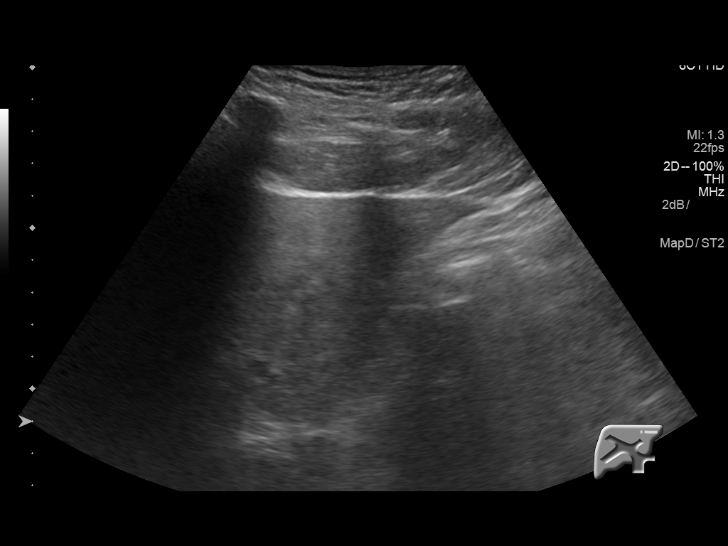
[im 69/69]
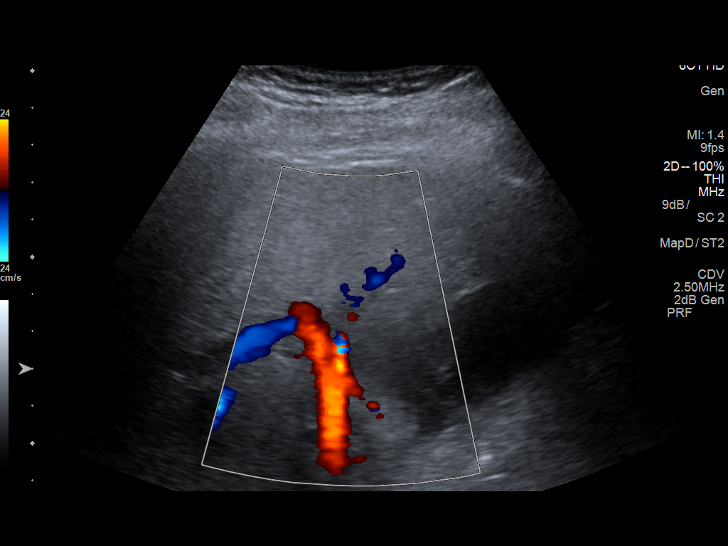

[14 of 25 positions shown; findings below may reference images not displayed]

FINDINGS: Gallbladder:

Sludge is seen within the gallbladder lumen. Mild gallbladder wall
thickening is noted at 4 mm without definite pericholecystic fluid.
No sonographic Murphy's sign is noted.

Common bile duct:

Diameter: 6 mm which is within normal limits.

Liver:

No focal lesion identified. Within normal limits in parenchymal
echogenicity. Portal vein is patent on color Doppler imaging with
normal direction of blood flow towards the liver.
IMPRESSION: Gallbladder sludge is noted with mild gallbladder wall thickening,
but no definite pericholecystic fluid or sonographic Murphy's sign
is noted. These findings may represent cholecystitis, and further
evaluation with HIDA scan may be performed.

## 2020-03-15 IMAGING — CT CT CTA ABD/PEL W/CM AND/OR W/O CM
2 of 9 series · 9 of 46 positions shown, 15 images · IV contrast (iopamidol)
Comparison: None.

CLINICAL DATA: 57-year-old male with upper abdominal cramping,
nausea and vomiting for 1 day.

EXAM:
CT ANGIOGRAPHY ABDOMEN AND PELVIS WITH CONTRAST AND WITHOUT CONTRAST
TECHNIQUE: Multidetector CT imaging of the abdomen and pelvis was performed
using the standard protocol during bolus administration of
intravenous contrast. Multiplanar reconstructed images and MIPs were
obtained and reviewed to evaluate the vascular anatomy.
CONTRAST:  100mL [id number] IOPAMIDOL ([id number]) INJECTION 76%

[Series 6: axial venous · axial · portal-venous · 0.74mm/px · z∈[-766,-376]mm · 7 of 105 slices shown, 12 images]
[im 14/105  soft-tissue]
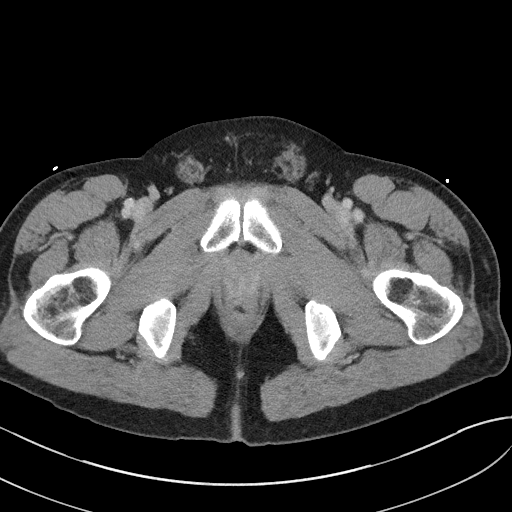
[im 14/105  bone]
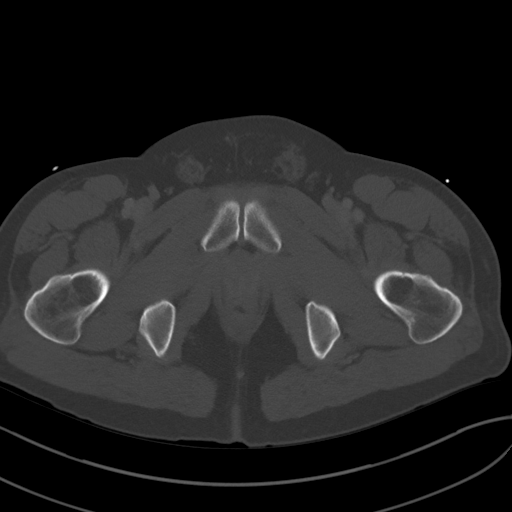
[im 27/105  soft-tissue]
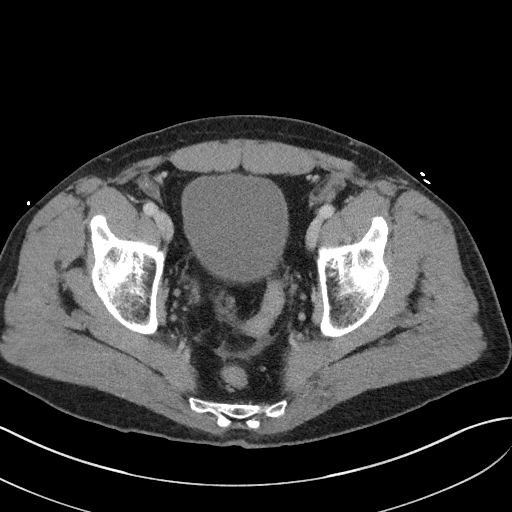
[im 40/105  soft-tissue]
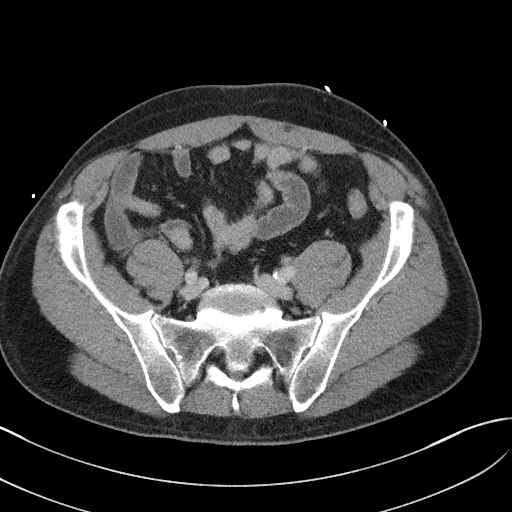
[im 53/105  soft-tissue]
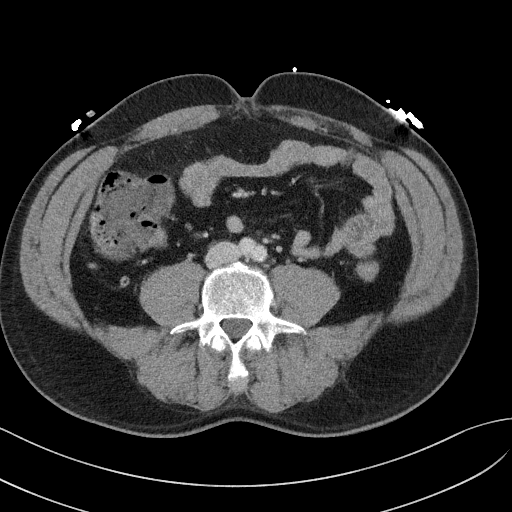
[im 53/105  lung]
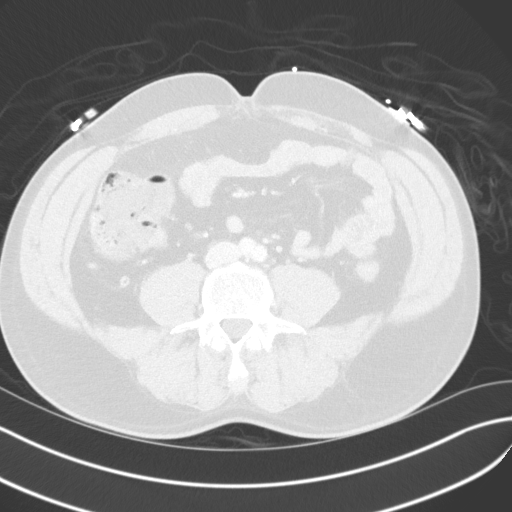
[im 66/105  soft-tissue]
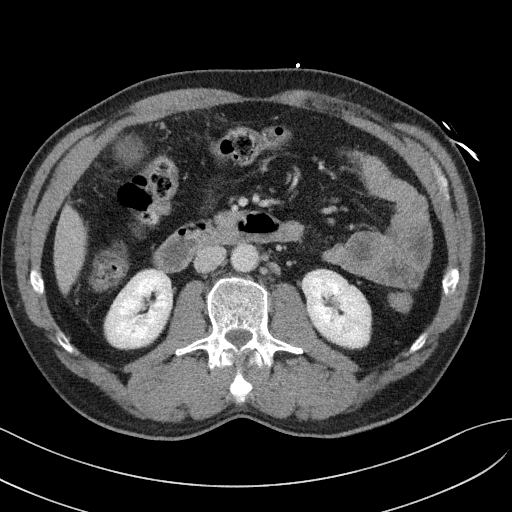
[im 66/105  lung]
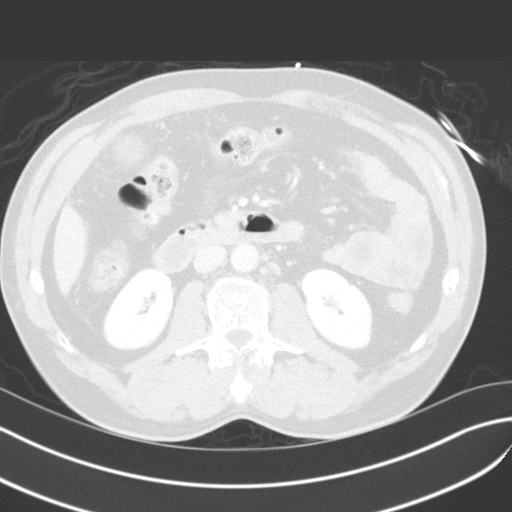
[im 79/105  soft-tissue]
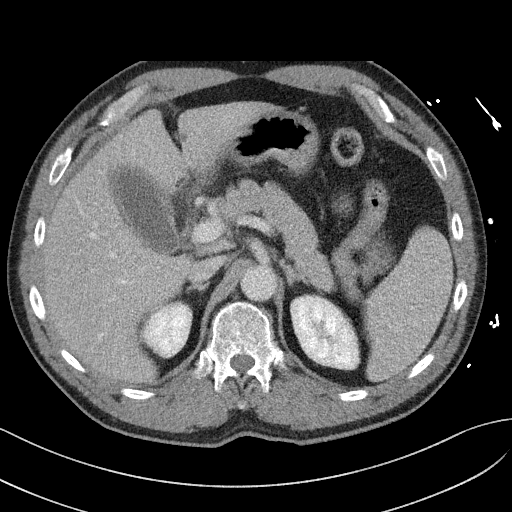
[im 79/105  lung]
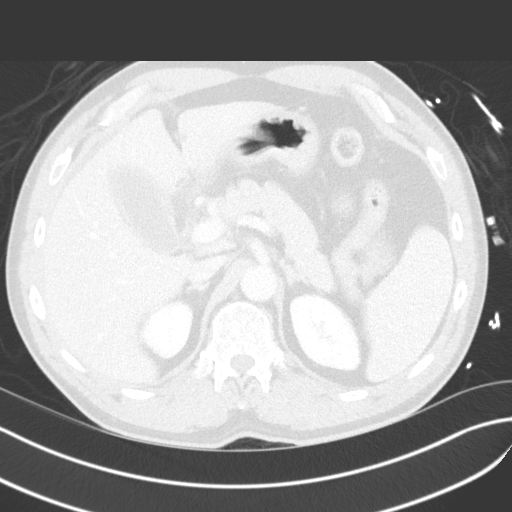
[im 92/105  soft-tissue]
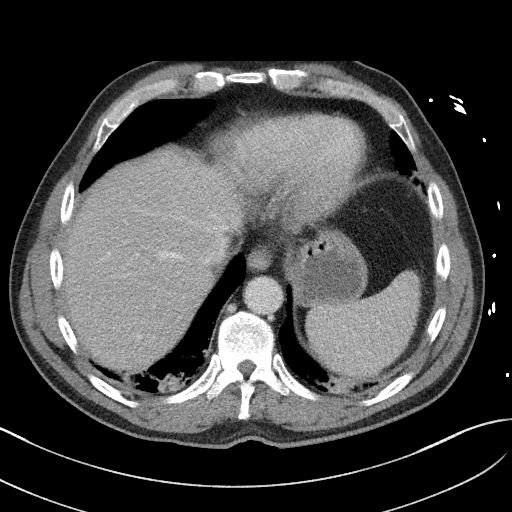
[im 92/105  lung]
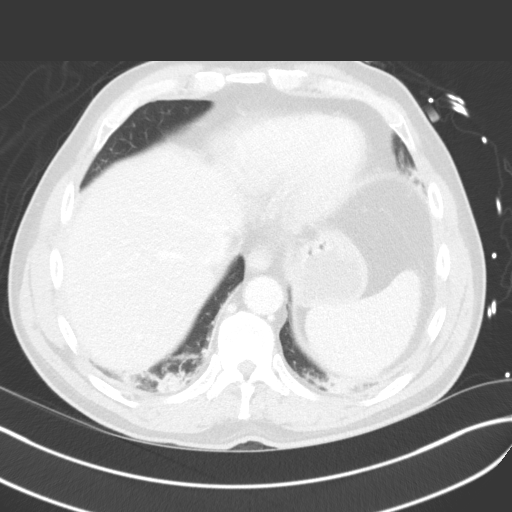

[Series 10: coronals · coronal · 0.79mm/px · 2 of 103 slices shown, 3 images]
[im 35/103  soft-tissue]
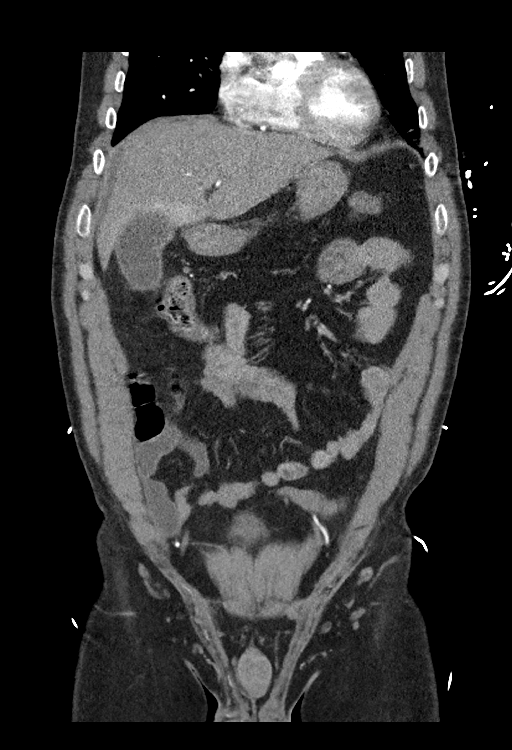
[im 35/103  bone]
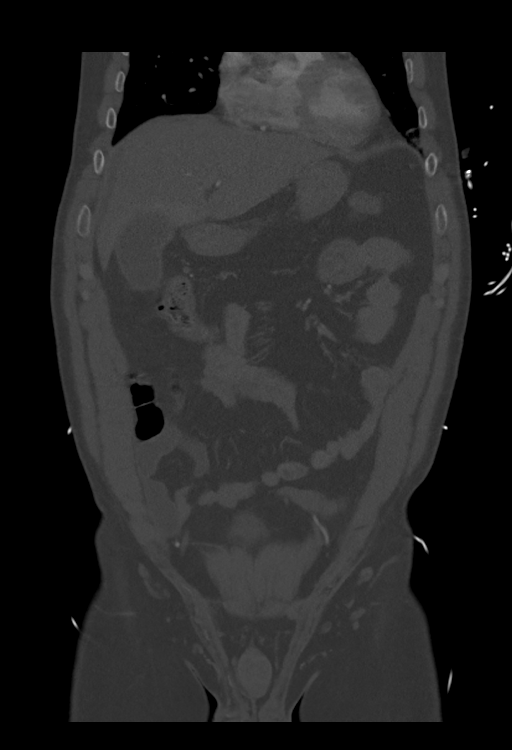
[im 69/103  soft-tissue]
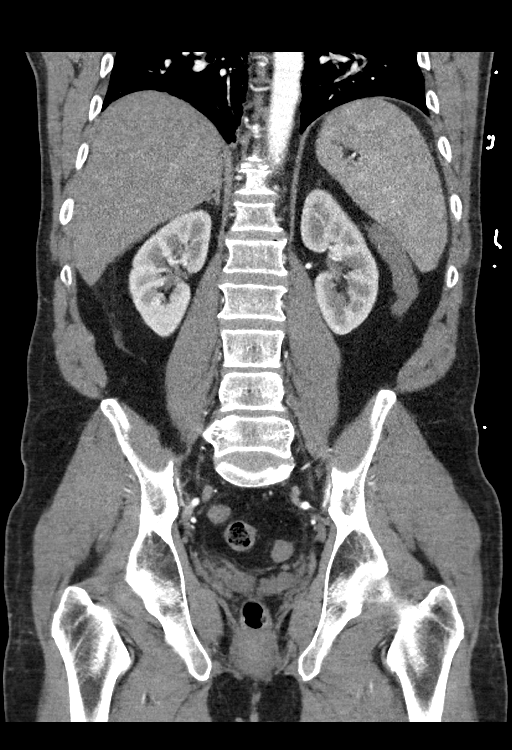

[9 of 46 positions shown; findings below may reference images not displayed]

FINDINGS: VASCULAR

Aorta: The aorta is normal in caliber without evidence of aneurysm
or dissection. Minimal calcified atherosclerotic plaque.

Celiac: Patent without evidence of aneurysm, dissection, vasculitis
or significant stenosis.

SMA: Patent without evidence of aneurysm, dissection, vasculitis or
significant stenosis.

Renals: Both renal arteries are patent without evidence of aneurysm,
dissection, vasculitis, fibromuscular dysplasia or significant
stenosis.

IMA: Patent without evidence of aneurysm, dissection, vasculitis or
significant stenosis.

Inflow: Patent without evidence of aneurysm, dissection, vasculitis
or significant stenosis.

Proximal Outflow: Bilateral common femoral and visualized portions
of the superficial and profunda femoral arteries are patent without
evidence of aneurysm, dissection, vasculitis or significant
stenosis.

Veins: No obvious venous abnormality within the limitations of this
arterial phase study.

Review of the MIP images confirms the above findings.

NON-VASCULAR

Lower chest: Trace dependent atelectasis bilaterally. Otherwise, the
visualized lower chest is unremarkable.

Hepatobiliary: Normal hepatic parenchyma with some arterial
hyperenhancement around the gallbladder. No discrete solid lesions.
There are a few tiny subcentimeter low-attenuation lesions which are
too small to characterize but statistically likely to represent
benign cysts. Mild gallbladder wall thickening. Suggestion of tiny
punctate echogenic foci in the region of the cystic duct. Small
volume fluid extends from the region of the gallbladder neck into
the pancreaticoduodenal groove. This may represent pericholecystic
fluid.

Pancreas: Normal enhancement of the pancreas. No definite pancreatic
edema or hypoperfusion. No mass lesion present. Fluid confined to
the pancreaticoduodenal groove.

Spleen: Normal in size without focal abnormality.

Adrenals/Urinary Tract: Normal adrenal glands. Symmetric enhancement
of the kidneys bilaterally. No hydronephrosis, nephrolithiasis or
enhancing renal mass. Tiny subcentimeter low-attenuation lesion
exophytic from the upper pole of the right kidney is too small to
characterize but statistically highly likely a benign cyst.

Stomach/Bowel: The stomach is within normal limits. Perhaps mild
wall thickening of the second portion of the duodenum which is
likely reactive. Normal appendix in the right lower quadrant. The
terminal ileum is unremarkable. No evidence of bowel obstruction.

Lymphatic: No suspicious lymphadenopathy.

Reproductive: Prostate is unremarkable.

Other: No abdominal wall hernia or abnormality. No abdominopelvic
ascites.

Musculoskeletal: No acute fracture or aggressive appearing lytic or
blastic osseous lesion.
IMPRESSION: VASCULAR

1. No acute arterial abnormality.
2.  Aortic Atherosclerosis ([id number]-170.0).

NON-VASCULAR

1. CT findings are most suggestive of acute cholecystitis. There
appear to be tiny punctate stones in the region of the gallbladder
neck/cystic duct, the gallbladder wall is slightly thickened, there
is hyperemia in the hepatic parenchyma adjacent to the gallbladder,
and finally there is pericholecystic fluid extending along the porta
hepatis into the pancreaticoduodenal groove. Additional differential
considerations which are considered less likely include primary
duodenitis, or mild pancreatitis. The epicenter appears to be the
gallbladder with secondary inflammation involving the
pancreaticoduodenal groove. Consider further evaluation with right
upper quadrant ultrasound.
